# Patient Record
Sex: Male | Born: 1985 | Race: Black or African American | Hispanic: No | Marital: Single | Smoking: Former smoker
Health system: Southern US, Community
[De-identification: ages and names within clinical notes are randomized; demographics above are authoritative.]

---

## 2011-12-27 ENCOUNTER — Emergency Department (HOSPITAL_COMMUNITY)
Admission: EM | Admit: 2011-12-27 | Discharge: 2011-12-27 | Disposition: A | Discharge status: Home / Self Care | Payer: Self-pay | Attending: Emergency Medicine | Admitting: Emergency Medicine

## 2011-12-27 ENCOUNTER — Encounter (HOSPITAL_COMMUNITY): Payer: Self-pay

## 2011-12-27 DIAGNOSIS — J45901 Unspecified asthma with (acute) exacerbation: Secondary | ICD-10-CM | POA: Insufficient documentation

## 2011-12-27 DIAGNOSIS — R0789 Other chest pain: Secondary | ICD-10-CM | POA: Insufficient documentation

## 2011-12-27 DIAGNOSIS — R0602 Shortness of breath: Secondary | ICD-10-CM | POA: Insufficient documentation

## 2011-12-27 MED ORDER — IPRATROPIUM BROMIDE 0.02 % IN SOLN
0.5000 mg | Freq: Once | RESPIRATORY_TRACT | Status: AC
  Administered 2011-12-27: 0.5 mg via RESPIRATORY_TRACT
  Filled 2011-12-27: qty 2.5

## 2011-12-27 MED ORDER — ALBUTEROL SULFATE (5 MG/ML) 0.5% IN NEBU
5.0000 mg | INHALATION_SOLUTION | Freq: Once | RESPIRATORY_TRACT | Status: AC
  Administered 2011-12-27: 5 mg via RESPIRATORY_TRACT
  Filled 2011-12-27: qty 1

## 2011-12-27 MED ORDER — PREDNISONE 20 MG PO TABS
40.0000 mg | ORAL_TABLET | Freq: Once | ORAL | Status: AC
  Administered 2011-12-27: 40 mg via ORAL
  Filled 2011-12-27: qty 2

## 2011-12-27 MED ORDER — ALBUTEROL SULFATE HFA 108 (90 BASE) MCG/ACT IN AERS
2.0000 | INHALATION_SPRAY | Freq: Four times a day (QID) | RESPIRATORY_TRACT | Status: DC
  Administered 2011-12-27: 2 via RESPIRATORY_TRACT
  Filled 2011-12-27: qty 6.7

## 2011-12-27 NOTE — ED Provider Notes (Signed)
History     CSN: [id number]  Arrival date & time 12/27/11  7829   First MD Initiated Contact with Patient 12/27/11 [phone]      Chief Complaint  Patient presents with  . Shortness of Breath    (Consider location/radiation/quality/duration/timing/severity/associated sxs/prior treatment) HPI Comments: Asthma: Jaquaveon Bilal presents for evaluation of asthma. Patient's symptoms include wheezing. Associated symptoms include wheezing. The patient has been suffering from these symptoms for approximately 2 days. Symptoms have been gradually worsening since their onset. Medications used in the past to treat these symptoms include no prescription medications. Suspected precipitants include pollens. Patient is awoken from sleep approximately 1 times per night. Patient has not required Emergency Room treatment for these symptoms, and has not required hospitalization. The patient has not been intubated in the past.   Patient is a 26 y.o. male presenting with shortness of breath. The history is provided by the patient.  Shortness of Breath  Associated symptoms include shortness of breath and wheezing. Pertinent negatives include no chest pain, no fever, no rhinorrhea, no sore throat, no stridor and no cough.    Past Medical History  Diagnosis Date  . Asthma     History reviewed. No pertinent past surgical history.  Family History  Problem Relation Age of Onset  . Asthma Other     History  Substance Use Topics  . Smoking status: Never Smoker   . Smokeless tobacco: Not on file  . Alcohol Use: Yes      Review of Systems  Constitutional: Negative for fever, chills and diaphoresis.  HENT: Negative for sore throat, rhinorrhea, drooling, trouble swallowing, neck stiffness, voice change and sinus pressure.   Eyes: Negative for visual disturbance.  Respiratory: Positive for chest tightness, shortness of breath and wheezing. Negative for cough, choking and stridor.   Cardiovascular: Negative  for chest pain and palpitations.  Gastrointestinal: Negative for abdominal pain.  Genitourinary: Negative for dysuria.  Musculoskeletal: Negative for back pain and gait problem.  Skin: Negative for rash.  Neurological: Negative for syncope, speech difficulty, light-headedness and headaches.  Psychiatric/Behavioral: Negative for confusion.  All other systems reviewed and are negative.    Allergies  Review of patient's allergies indicates no known allergies.  Home Medications   Current Outpatient Rx  Name Route Sig Dispense Refill  . ALBUTEROL SULFATE HFA 108 (90 BASE) MCG/ACT IN AERS Inhalation Inhale 2 puffs into the lungs every 6 (six) hours as needed. For shortness of breath    . PSEUDOEPHEDRINE-APAP-DM 60-1000-30 MG/30ML PO LIQD Oral Take 30 mLs by mouth daily as needed. For cold and flu symptoms      BP 118/70  Pulse 63  Temp(Src) 98.2 F (36.8 C) (Oral)  Resp 24  SpO2 96%  Physical Exam  Constitutional: He is oriented to person, place, and time. He appears well-developed and well-nourished. No distress.  HENT:  Head: Normocephalic and atraumatic.       Uvula midline, oropharynx clear and moist  Eyes: Conjunctivae and EOM are normal. Pupils are equal, round, and reactive to light.  Neck: Normal range of motion.       No stridor.  Neck supple with full range of motion.  Cardiovascular: Normal rate, regular rhythm, normal heart sounds and intact distal pulses.   Pulmonary/Chest: Effort normal. He has wheezes (expiratory). He exhibits tenderness.       Patient not in respiratory distress, no accessory muscle use, nasal flaring.  Patient able to speak in full sentences.  Airway intact.  Lung auscultation  positive for bilateral expiratory wheezing  Abdominal: Soft. There is no tenderness.  Musculoskeletal: Normal range of motion. He exhibits no edema and no tenderness.  Neurological: He is alert and oriented to person, place, and time.  Skin: Skin is warm and dry. No rash  noted. He is not diaphoretic.  Psychiatric: He has a normal mood and affect. His behavior is normal.    ED Course  Procedures (including critical care time)  Labs Reviewed - No data to display No results found.   No diagnosis found.  Pt received 2 nebs and symptoms resolved, given inhaler  MDM  Asthma exacerbation  Patient ambulated in ED with O2 saturations maintained >90, no current signs of respiratory distress. Lung exam improved after hospital treatment. Pt states they are breathing at baseline. Pt has been instructed to continue using prescribed medications and to speak with PCP about today's exacerbation.             Jaci Carrel, [place] 12/27/11 [phone]

## 2011-12-27 NOTE — ED Notes (Signed)
Patient presents with intermittent shortness of breath x 2 days. Patient has a history of asthma and reports he gets a breathing treatment every 4 or 5 years.  Patient is also out of his inhaler and would like a refill.  Last refill was 4 years ago.  No wheezing noted upon auscultation.

## 2011-12-27 NOTE — Discharge Instructions (Signed)

## 2011-12-27 NOTE — ED Provider Notes (Signed)
Medical screening examination/treatment/procedure(s) were performed by non-physician practitioner and as supervising physician I was immediately available for consultation/collaboration.   Benny Lennert, MD 12/27/11 [phone]

## 2012-08-30 ENCOUNTER — Encounter (HOSPITAL_COMMUNITY): Payer: Self-pay | Admitting: Emergency Medicine

## 2012-08-30 ENCOUNTER — Emergency Department (HOSPITAL_COMMUNITY)
Admission: EM | Admit: 2012-08-30 | Discharge: 2012-08-30 | Disposition: A | Discharge status: Home / Self Care | Payer: No Typology Code available for payment source | Attending: Emergency Medicine | Admitting: Emergency Medicine

## 2012-08-30 ENCOUNTER — Emergency Department (HOSPITAL_COMMUNITY): Payer: No Typology Code available for payment source

## 2012-08-30 DIAGNOSIS — Y92410 Unspecified street and highway as the place of occurrence of the external cause: Secondary | ICD-10-CM | POA: Insufficient documentation

## 2012-08-30 DIAGNOSIS — Y9389 Activity, other specified: Secondary | ICD-10-CM | POA: Insufficient documentation

## 2012-08-30 DIAGNOSIS — Z79899 Other long term (current) drug therapy: Secondary | ICD-10-CM | POA: Insufficient documentation

## 2012-08-30 DIAGNOSIS — J45909 Unspecified asthma, uncomplicated: Secondary | ICD-10-CM | POA: Insufficient documentation

## 2012-08-30 DIAGNOSIS — S335XXA Sprain of ligaments of lumbar spine, initial encounter: Secondary | ICD-10-CM | POA: Insufficient documentation

## 2012-08-30 DIAGNOSIS — S39012A Strain of muscle, fascia and tendon of lower back, initial encounter: Secondary | ICD-10-CM

## 2012-08-30 MED ORDER — CYCLOBENZAPRINE HCL 10 MG PO TABS: 10.0000 mg | ORAL_TABLET | Freq: Three times a day (TID) | ORAL | Status: DC | PRN
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
## 2013-01-17 ENCOUNTER — Emergency Department (HOSPITAL_COMMUNITY)
Admission: EM | Admit: 2013-01-17 | Discharge: 2013-01-18 | Disposition: A | Discharge status: Home / Self Care | Payer: No Typology Code available for payment source | Attending: Emergency Medicine | Admitting: Emergency Medicine

## 2013-01-17 ENCOUNTER — Encounter (HOSPITAL_COMMUNITY): Payer: Self-pay | Admitting: Emergency Medicine

## 2013-01-17 DIAGNOSIS — R0982 Postnasal drip: Secondary | ICD-10-CM | POA: Insufficient documentation

## 2013-01-17 DIAGNOSIS — R6889 Other general symptoms and signs: Secondary | ICD-10-CM | POA: Insufficient documentation

## 2013-01-17 DIAGNOSIS — Z79899 Other long term (current) drug therapy: Secondary | ICD-10-CM | POA: Insufficient documentation

## 2013-01-17 DIAGNOSIS — J309 Allergic rhinitis, unspecified: Secondary | ICD-10-CM | POA: Insufficient documentation

## 2013-01-17 DIAGNOSIS — J45901 Unspecified asthma with (acute) exacerbation: Secondary | ICD-10-CM | POA: Insufficient documentation

## 2013-01-17 DIAGNOSIS — J3489 Other specified disorders of nose and nasal sinuses: Secondary | ICD-10-CM | POA: Insufficient documentation

## 2013-01-17 DIAGNOSIS — R0789 Other chest pain: Secondary | ICD-10-CM | POA: Insufficient documentation

## 2013-01-17 DIAGNOSIS — R059 Cough, unspecified: Secondary | ICD-10-CM | POA: Insufficient documentation

## 2013-01-17 DIAGNOSIS — R0682 Tachypnea, not elsewhere classified: Secondary | ICD-10-CM | POA: Insufficient documentation

## 2013-01-17 DIAGNOSIS — R05 Cough: Secondary | ICD-10-CM | POA: Insufficient documentation

## 2013-01-17 DIAGNOSIS — Z9109 Other allergy status, other than to drugs and biological substances: Secondary | ICD-10-CM

## 2013-01-17 MED ORDER — IPRATROPIUM BROMIDE 0.02 % IN SOLN
1.0000 mg | Freq: Once | RESPIRATORY_TRACT | Status: AC
  Administered 2013-01-17: 1 mg via RESPIRATORY_TRACT
  Filled 2013-01-17: qty 5

## 2013-01-17 MED ORDER — ALBUTEROL (5 MG/ML) CONTINUOUS INHALATION SOLN
10.0000 mg/h | INHALATION_SOLUTION | RESPIRATORY_TRACT | Status: AC
  Administered 2013-01-17: 10 mg/h via RESPIRATORY_TRACT
  Filled 2013-01-17: qty 20

## 2013-01-17 MED ORDER — PREDNISONE 20 MG PO TABS
60.0000 mg | ORAL_TABLET | Freq: Once | ORAL | Status: AC
  Administered 2013-01-18: 60 mg via ORAL
  Filled 2013-01-17: qty 3

## 2013-01-17 NOTE — ED Notes (Signed)
Pt. C/o SOB x6 days, states onset it at approx 2300 every night, states albuterol inhaler has not relieved symptoms. Pt able to speak in short sentences. Pt reports recently moving to new house x6 days ago.

## 2013-01-17 NOTE — ED Provider Notes (Signed)
History     CSN: [id number]  Arrival date & time 01/17/13  2336   First MD Initiated Contact with Patient 01/17/13 2341      Chief Complaint  Patient presents with  . Asthma    (Consider location/radiation/quality/duration/timing/severity/associated sxs/prior treatment) Patient is a 27 y.o. male presenting with asthma. The history is provided by the patient, medical records and a significant other. No language interpreter was used.  Asthma This is a recurrent problem. The current episode started in the past 7 days. The problem occurs daily. The problem has been unchanged. Associated symptoms include coughing (mild with exacerbation). Pertinent negatives include no abdominal pain, anorexia, arthralgias, change in bowel habit, chest pain, chills, congestion, diaphoresis, fatigue, fever, headaches, joint swelling, myalgias, nausea, neck pain, numbness, rash, sore throat, swollen glands, urinary symptoms, vertigo, visual change, vomiting or weakness. The symptoms are aggravated by exertion. Treatments tried: albuterol MDI. The treatment provided mild relief.    Satoshi Kalas is a 28 y.o. male  with a hx of asthma presents to the Emergency Department complaining of gradual, intermittent, progressively worsening SOB with associated wheezing only at night for the last 6 days. Pt just moved to  and into a new house with his girlfriend.  Pt uses an albuterol MDI for rescue, but does not take a daily medicaiton. Associated symptoms include rhinorrhea, sneezing and cough only with episodes of SOB.  Albuterol makes it better for a short time and exertion and nightime makes it worse.  Pt denies fever, chills, headache, neck pain, chest pain, abdominal pain, N/V/D, weakness, dizziness, syncope, dysuria.     Past Medical History  Diagnosis Date  . Asthma     History reviewed. No pertinent past surgical history.  Family History  Problem Relation Age of Onset  . Asthma Other     History   Substance Use Topics  . Smoking status: Never Smoker   . Smokeless tobacco: Not on file  . Alcohol Use: Yes      Review of Systems  Constitutional: Negative for fever, chills, diaphoresis, appetite change and fatigue.  HENT: Positive for rhinorrhea, sneezing and postnasal drip. Negative for ear pain, congestion, sore throat, mouth sores, neck pain, neck stiffness, sinus pressure and ear discharge.   Eyes: Negative for visual disturbance.  Respiratory: Positive for cough (mild with exacerbation), chest tightness, shortness of breath and wheezing. Negative for stridor.   Cardiovascular: Negative for chest pain, palpitations and leg swelling.  Gastrointestinal: Negative for nausea, vomiting, abdominal pain, diarrhea, anorexia and change in bowel habit.  Genitourinary: Negative for dysuria, urgency, frequency and hematuria.  Musculoskeletal: Negative for myalgias, back pain, joint swelling and arthralgias.  Skin: Negative for rash.  Neurological: Negative for vertigo, syncope, weakness, light-headedness, numbness and headaches.  Hematological: Negative for adenopathy.  Psychiatric/Behavioral: The patient is not nervous/anxious.   All other systems reviewed and are negative.    Allergies  Review of patient's allergies indicates no known allergies.  Home Medications   Current Outpatient Rx  Name  Route  Sig  Dispense  Refill  . albuterol (PROVENTIL HFA;VENTOLIN HFA) 108 (90 BASE) MCG/ACT inhaler   Inhalation   Inhale 2 puffs into the lungs every 4 (four) hours as needed for wheezing or shortness of breath.   1 Inhaler   3   . albuterol (PROVENTIL) (5 MG/ML) 0.5% nebulizer solution   Nebulization   Take 0.5 mLs (2.5 mg total) by nebulization every 4 (four) hours as needed for wheezing or shortness of breath.  20 mL   12   . cetirizine (ZYRTEC ALLERGY) 10 MG tablet   Oral   Take 1 tablet (10 mg total) by mouth daily.   30 tablet   1   . predniSONE (DELTASONE) 20 MG  tablet   Oral   Take 2 tablets (40 mg total) by mouth daily.   10 tablet   0     BP 116/63  Pulse 69  Temp(Src) 98.4 F (36.9 C) (Oral)  Resp 22  SpO2 99%  Physical Exam  Constitutional: He is oriented to person, place, and time. He appears well-developed and well-nourished. No distress.  HENT:  Head: Normocephalic and atraumatic.  Right Ear: Tympanic membrane, external ear and ear canal normal.  Left Ear: Tympanic membrane, external ear and ear canal normal.  Nose: Mucosal edema and rhinorrhea present. No epistaxis. Right sinus exhibits no maxillary sinus tenderness and no frontal sinus tenderness. Left sinus exhibits no maxillary sinus tenderness and no frontal sinus tenderness.  Mouth/Throat: Uvula is midline, oropharynx is clear and moist and mucous membranes are normal. Mucous membranes are not pale and not cyanotic. No oropharyngeal exudate, posterior oropharyngeal edema, posterior oropharyngeal erythema or tonsillar abscesses.  Eyes: Conjunctivae and EOM are normal. Pupils are equal, round, and reactive to light.  Neck: Normal range of motion and full passive range of motion without pain.  Cardiovascular: Normal rate, regular rhythm, normal heart sounds and intact distal pulses.   No murmur heard. Pulmonary/Chest: Accessory muscle usage present. No stridor. Tachypnea noted. He is in respiratory distress. He has decreased breath sounds. He has wheezes. He has no rhonchi. He has no rales. He exhibits no tenderness.  Abdominal: Soft. Bowel sounds are normal.  Musculoskeletal: Normal range of motion. He exhibits no edema and no tenderness.  Lymphadenopathy:    He has no cervical adenopathy.  Neurological: He is alert and oriented to person, place, and time. He exhibits normal muscle tone. Coordination normal.  Skin: Skin is warm and dry. No rash noted. He is not diaphoretic. No erythema.  Psychiatric: He has a normal mood and affect.    ED Course  Procedures (including  critical care time)  Labs Reviewed - No data to display No results found.   1. Asthma exacerbation   2. Environmental allergies       MDM  Nephtali Docken presents with asthma exacerbation.  Patient ambulated in ED with O2 saturations maintained >90, no current signs of respiratory distress. Lung exam improved after nebulizer treatment. Prednisone given in the ED and pt will bd dc with 5 day burst. Pt states they are breathing at baseline. Pt has been instructed to continue using prescribed medications and to speak with PCP about today's exacerbation.          Dahlia Client Karisma Meiser, PA-C 01/18/13 [phone]

## 2013-01-17 NOTE — ED Notes (Addendum)
Respiratory paged for continuous neb.  

## 2013-01-17 NOTE — ED Notes (Addendum)
EDPA at bedside for assessment 

## 2013-01-17 NOTE — ED Notes (Signed)
Recently moved here and has been having sob and wheezing at night over the last 6 days.  Using inhaler without any relief.

## 2013-01-18 MED ORDER — CETIRIZINE HCL 10 MG PO TABS: 10.0000 mg | ORAL_TABLET | Freq: Every day | ORAL | Status: AC

## 2013-01-18 MED ORDER — PREDNISONE 20 MG PO TABS: 40.0000 mg | ORAL_TABLET | Freq: Every day | ORAL | Status: DC

## 2013-01-18 MED ORDER — ALBUTEROL SULFATE (5 MG/ML) 0.5% IN NEBU: 2.5000 mg | INHALATION_SOLUTION | RESPIRATORY_TRACT | Status: DC | PRN

## 2013-01-18 MED ORDER — ALBUTEROL SULFATE HFA 108 (90 BASE) MCG/ACT IN AERS: 2.0000 | INHALATION_SPRAY | RESPIRATORY_TRACT | Status: DC | PRN

## 2013-01-18 NOTE — ED Notes (Signed)
Pt ambulatory in hallway without issue. SpO2 90-96%, Pulse 91-106 during ambulation.

## 2013-01-18 NOTE — ED Notes (Signed)
PT comfortable with discharge and f/u instructions. In NAD at this time.

## 2013-01-18 NOTE — ED Provider Notes (Signed)
Medical screening examination/treatment/procedure(s) were performed by non-physician practitioner and as supervising physician I was immediately available for consultation/collaboration.   Hanley Seamen, MD 01/18/13 [phone]

## 2013-02-04 ENCOUNTER — Emergency Department (HOSPITAL_COMMUNITY)
Admission: EM | Admit: 2013-02-04 | Discharge: 2013-02-04 | Disposition: A | Discharge status: Home / Self Care | Payer: Self-pay | Attending: Emergency Medicine | Admitting: Emergency Medicine

## 2013-02-04 ENCOUNTER — Encounter (HOSPITAL_COMMUNITY): Payer: Self-pay | Admitting: *Deleted

## 2013-02-04 DIAGNOSIS — Z87891 Personal history of nicotine dependence: Secondary | ICD-10-CM | POA: Insufficient documentation

## 2013-02-04 DIAGNOSIS — R0602 Shortness of breath: Secondary | ICD-10-CM | POA: Insufficient documentation

## 2013-02-04 DIAGNOSIS — J45901 Unspecified asthma with (acute) exacerbation: Secondary | ICD-10-CM | POA: Insufficient documentation

## 2013-02-04 DIAGNOSIS — Z79899 Other long term (current) drug therapy: Secondary | ICD-10-CM | POA: Insufficient documentation

## 2013-02-04 MED ORDER — ALBUTEROL SULFATE (5 MG/ML) 0.5% IN NEBU
5.0000 mg | INHALATION_SOLUTION | Freq: Once | RESPIRATORY_TRACT | Status: AC
  Administered 2013-02-04: 5 mg via RESPIRATORY_TRACT
  Filled 2013-02-04: qty 1

## 2013-02-04 MED ORDER — ALBUTEROL SULFATE HFA 108 (90 BASE) MCG/ACT IN AERS
2.0000 | INHALATION_SPRAY | RESPIRATORY_TRACT | Status: DC | PRN
  Administered 2013-02-04: 2 via RESPIRATORY_TRACT
  Filled 2013-02-04: qty 6.7

## 2013-02-04 MED ORDER — PREDNISONE 20 MG PO TABS: 60.0000 mg | ORAL_TABLET | Freq: Every day | ORAL | Status: DC

## 2013-02-04 MED ORDER — PREDNISONE 20 MG PO TABS
60.0000 mg | ORAL_TABLET | Freq: Once | ORAL | Status: AC
  Administered 2013-02-04: 60 mg via ORAL
  Filled 2013-02-04: qty 3

## 2013-02-04 MED ORDER — ALBUTEROL SULFATE (2.5 MG/3ML) 0.083% IN NEBU: 2.5000 mg | INHALATION_SOLUTION | Freq: Four times a day (QID) | RESPIRATORY_TRACT | Status: DC | PRN

## 2013-02-04 NOTE — ED Provider Notes (Signed)
History     CSN: [id number]  Arrival date & time 02/04/13  4696   First MD Initiated Contact with Patient 02/04/13 0403      Chief Complaint  Patient presents with  . Asthma    (Consider location/radiation/quality/duration/timing/severity/associated sxs/prior treatment) HPI History provided by patient, wheezing since yesterday worse today, no cough,some SOB, no F/C, he attributes his symptoms to turning on the [facility] in house, no known triggers otherwise, no h/o intubation or admit for lifelong h/o asthma. Symptoms MOD in severity, worse with exertion.   Past Medical History  Diagnosis Date  . Asthma     History reviewed. No pertinent past surgical history.  Family History  Problem Relation Age of Onset  . Asthma Other     History  Substance Use Topics  . Smoking status: Former Games developer  . Smokeless tobacco: Not on file  . Alcohol Use: Yes     Comment: socially      Review of Systems  Constitutional: Negative for fever and chills.  HENT: Negative for sore throat, neck pain, neck stiffness and voice change.   Eyes: Negative for pain.  Respiratory: Positive for shortness of breath and wheezing.   Cardiovascular: Negative for chest pain.  Gastrointestinal: Negative for abdominal pain.  Genitourinary: Negative for dysuria.  Musculoskeletal: Negative for back pain.  Skin: Negative for rash.  Neurological: Negative for headaches.  All other systems reviewed and are negative.    Allergies  Review of patient's allergies indicates no known allergies.  Home Medications   Current Outpatient Rx  Name  Route  Sig  Dispense  Refill  . albuterol (PROVENTIL HFA;VENTOLIN HFA) 108 (90 BASE) MCG/ACT inhaler   Inhalation   Inhale 2 puffs into the lungs every 4 (four) hours as needed for wheezing or shortness of breath.   1 Inhaler   3   . albuterol (PROVENTIL) (5 MG/ML) 0.5% nebulizer solution   Nebulization   Take 0.5 mLs (2.5 mg total) by nebulization every 4 (four)  hours as needed for wheezing or shortness of breath.   20 mL   12   . cetirizine (ZYRTEC ALLERGY) 10 MG tablet   Oral   Take 1 tablet (10 mg total) by mouth daily.   30 tablet   1     BP 110/86  Pulse 74  Temp(Src) 98 F (36.7 C) (Oral)  Resp 20  SpO2 98%  Physical Exam  Constitutional: He is oriented to person, place, and time. He appears well-developed and well-nourished.  HENT:  Head: Normocephalic and atraumatic.  Eyes: EOM are normal. Pupils are equal, round, and reactive to light.  Neck: Neck supple. No tracheal deviation present.  Cardiovascular: Normal rate, regular rhythm and intact distal pulses.   Pulmonary/Chest: Effort normal. No stridor. No respiratory distress.  bilat exp wheezes  Abdominal: Soft. Bowel sounds are normal. He exhibits no distension. There is no tenderness.  Musculoskeletal: Normal range of motion. He exhibits no edema.  Neurological: He is alert and oriented to person, place, and time.  Skin: Skin is warm and dry.    ED Course  Procedures (including critical care time)  Albuterol treatment and prednisone provided  Old records - recent prior ED visit for same received albuterol and steroids  4:43 AM recheck - feeling much better, wants to be discharged, requesting RX/ referrals provided with return precautions.  Repeat exam wheezes improved, moving good air.   MDM  Acute asthma exacerbation Medications provided Serial evaluations VS, old records and nursing  notes reviewed       Sunnie Nielsen, MD 02/04/13 [phone]

## 2013-02-04 NOTE — ED Notes (Signed)
Pt states that he has a history of Asthma and has been using Nebs without improvement; pt states that he has felt short of breath x 2 days

## 2013-02-23 ENCOUNTER — Emergency Department (HOSPITAL_COMMUNITY)
Admission: EM | Admit: 2013-02-23 | Discharge: 2013-02-23 | Disposition: A | Discharge status: Home / Self Care | Payer: Self-pay | Attending: Emergency Medicine | Admitting: Emergency Medicine

## 2013-02-23 ENCOUNTER — Encounter (HOSPITAL_COMMUNITY): Payer: Self-pay | Admitting: Emergency Medicine

## 2013-02-23 DIAGNOSIS — Z79899 Other long term (current) drug therapy: Secondary | ICD-10-CM | POA: Insufficient documentation

## 2013-02-23 DIAGNOSIS — Z87891 Personal history of nicotine dependence: Secondary | ICD-10-CM | POA: Insufficient documentation

## 2013-02-23 DIAGNOSIS — IMO0002 Reserved for concepts with insufficient information to code with codable children: Secondary | ICD-10-CM | POA: Insufficient documentation

## 2013-02-23 DIAGNOSIS — J45901 Unspecified asthma with (acute) exacerbation: Secondary | ICD-10-CM | POA: Insufficient documentation

## 2013-02-23 DIAGNOSIS — J4541 Moderate persistent asthma with (acute) exacerbation: Secondary | ICD-10-CM

## 2013-02-23 MED ORDER — ALBUTEROL SULFATE (5 MG/ML) 0.5% IN NEBU: 5.0000 mg | INHALATION_SOLUTION | Freq: Once | RESPIRATORY_TRACT | Status: DC

## 2013-02-23 MED ORDER — IPRATROPIUM BROMIDE 0.02 % IN SOLN
0.5000 mg | Freq: Once | RESPIRATORY_TRACT | Status: AC
  Administered 2013-02-23: 0.5 mg via RESPIRATORY_TRACT
  Filled 2013-02-23: qty 2.5

## 2013-02-23 MED ORDER — PREDNISONE 10 MG PO TABS: 10.0000 mg | ORAL_TABLET | Freq: Every day | ORAL | Status: DC

## 2013-02-23 MED ORDER — ALBUTEROL SULFATE (5 MG/ML) 0.5% IN NEBU
5.0000 mg | INHALATION_SOLUTION | Freq: Once | RESPIRATORY_TRACT | Status: DC
  Filled 2013-02-23: qty 1

## 2013-02-23 MED ORDER — IPRATROPIUM BROMIDE 0.02 % IN SOLN: 0.5000 mg | Freq: Once | RESPIRATORY_TRACT | Status: DC

## 2013-02-23 MED ORDER — PREDNISONE 20 MG PO TABS
60.0000 mg | ORAL_TABLET | Freq: Once | ORAL | Status: AC
  Administered 2013-02-23: 60 mg via ORAL
  Filled 2013-02-23: qty 3

## 2013-02-23 MED ORDER — ALBUTEROL SULFATE (5 MG/ML) 0.5% IN NEBU
5.0000 mg | INHALATION_SOLUTION | Freq: Once | RESPIRATORY_TRACT | Status: AC
  Administered 2013-02-23: 5 mg via RESPIRATORY_TRACT

## 2013-02-23 MED ORDER — FLUTICASONE PROPIONATE HFA 110 MCG/ACT IN AERO: 1.0000 | INHALATION_SPRAY | Freq: Two times a day (BID) | RESPIRATORY_TRACT | Status: DC

## 2013-02-23 MED ORDER — ALBUTEROL SULFATE (5 MG/ML) 0.5% IN NEBU
5.0000 mg | INHALATION_SOLUTION | Freq: Once | RESPIRATORY_TRACT | Status: AC
  Administered 2013-02-23: 5 mg via RESPIRATORY_TRACT
  Filled 2013-02-23: qty 1

## 2013-02-23 NOTE — ED Notes (Signed)
Family at bedside. 

## 2013-02-23 NOTE — ED Provider Notes (Signed)
Patient seen/examined in the Emergency Department in conjunction with Midlevel Provider Neva Seat Patient reports short of breath due to asthma Exam : awake/alert, scattered wheeze but improved after nebs Plan: likely d/c home.  May benefit from longer oral steroid regimen.  He may also need daily inhaled corticosteroid due to frequent asthma exacerbations   Joya Gaskins, MD 02/23/13 [phone]

## 2013-02-23 NOTE — ED Provider Notes (Signed)
History     CSN: [id number]  Arrival date & time 02/23/13  6295   None     Chief Complaint  Patient presents with  . Asthma    (Consider location/radiation/quality/duration/timing/severity/associated sxs/prior treatment) HPI   Cody Terrell is a 27 y.o.male presenting to the ER with complaints of wheezing the past couple of days. He used his nebulizer treatment once last night which helped for a few hours. No cough,some SOB. He recently moved from a location with a lot of allergens into a much cleaner location which he says has helped.  He has been to the ER 3 times in the past couple of months for his asthma and says that these past couple of months it has been worse but he is not sure why. Has been told he should take an allergy pill but hasnt done it. Says he will do so this time to prevent having to come to the ED., no h/o intubation or admit for lifelong h/o asthma. Symptoms MOD in severity, worse with exertion.    Past Medical History  Diagnosis Date  . Asthma     History reviewed. No pertinent past surgical history.  Family History  Problem Relation Age of Onset  . Asthma Other     History  Substance Use Topics  . Smoking status: Former Games developer  . Smokeless tobacco: Not on file  . Alcohol Use: Yes     Comment: socially      Review of Systems  Respiratory: Positive for shortness of breath and wheezing. Negative for apnea, cough, choking, chest tightness and stridor.   All other systems reviewed and are negative.    Allergies  Review of patient's allergies indicates no known allergies.  Home Medications   Current Outpatient Rx  Name  Route  Sig  Dispense  Refill  . albuterol (PROVENTIL HFA;VENTOLIN HFA) 108 (90 BASE) MCG/ACT inhaler   Inhalation   Inhale 2 puffs into the lungs every 4 (four) hours as needed for wheezing or shortness of breath.   1 Inhaler   3   . albuterol (PROVENTIL) (2.5 MG/3ML) 0.083% nebulizer solution   Nebulization   Take 3  mLs (2.5 mg total) by nebulization every 6 (six) hours as needed for wheezing.   75 mL   12   . cetirizine (ZYRTEC ALLERGY) 10 MG tablet   Oral   Take 1 tablet (10 mg total) by mouth daily.   30 tablet   1   . fluticasone (FLOVENT HFA) 110 MCG/ACT inhaler   Inhalation   Inhale 1 puff into the lungs 2 (two) times daily.   1 Inhaler   1   . predniSONE (DELTASONE) 10 MG tablet   Oral   Take 1 tablet (10 mg total) by mouth daily.   30 tablet   0     Prednisone dose pack directions:   5 tabs on day ...     BP 116/77  Pulse 65  Temp(Src) 97.7 F (36.5 C) (Oral)  Resp 25  SpO2 97%  Physical Exam Constitutional: Negative for fever and chills.  HENT: Negative for sore throat, neck pain, neck stiffness and voice change.  Eyes: Negative for pain.  Respiratory: Positive for shortness of breath and wheezing.  Cardiovascular: Negative for chest pain.  Gastrointestinal: Negative for abdominal pain.  Genitourinary: Negative for dysuria.  Musculoskeletal: Negative for back pain.  Skin: Negative for rash.  Neurological: Negative for headaches.  All other systems reviewed and are negative.  ED Course  Procedures (including critical care time)  Labs Reviewed - No data to display No results found.   1. Asthma exacerbation attacks, moderate persistent       MDM  Prednisone, alb/atrovent neb. Will monitor. 5:54am  7:49am- Dr. Bebe Shaggy saw patient as well. Will give Rx for Flovent, advise him and his girlfriend that it costs $200 a month. They will buy it the first month and then I give them resources for the [employer], [place], family practice clinics in the area. Coupon found for [employer] and printed off to give to girlfriend/pt.    Albuterol/Atrovent neb treatment given 1 more, pt feeling much better.   Rx: Flovent and 10 day prednisone taper.   27 y.o.Cody Terrell's evaluation in the Emergency Department is complete. It has been determined  that no acute conditions requiring further emergency intervention are present at this time. The patient/guardian have been advised of the diagnosis and plan. We have discussed signs and symptoms that warrant return to the ED, such as changes or worsening in symptoms.  Vital signs are stable at discharge. Filed Vitals:   02/23/13 0700  BP: 116/77  Pulse: 65  Temp:   Resp:     Patient/guardian has voiced understanding and agreed to follow-up with the PCP or specialist.       Dorthula Matas, PA-C 02/23/13 [phone]

## 2013-02-24 NOTE — ED Provider Notes (Signed)
Medical screening examination/treatment/procedure(s) were conducted as a shared visit with non-physician practitioner(s) and myself.  I personally evaluated the patient during the encounter  Pt improved in the ED and he is stable for d/c   Joya Gaskins, MD 02/24/13 [phone]

## 2014-01-11 ENCOUNTER — Encounter (HOSPITAL_COMMUNITY): Payer: Self-pay | Admitting: Emergency Medicine

## 2014-01-11 ENCOUNTER — Emergency Department (HOSPITAL_COMMUNITY)
Admission: EM | Admit: 2014-01-11 | Discharge: 2014-01-11 | Disposition: A | Discharge status: Home / Self Care | Payer: No Typology Code available for payment source | Attending: Emergency Medicine | Admitting: Emergency Medicine

## 2014-01-11 DIAGNOSIS — J45901 Unspecified asthma with (acute) exacerbation: Secondary | ICD-10-CM | POA: Insufficient documentation

## 2014-01-11 DIAGNOSIS — Z79899 Other long term (current) drug therapy: Secondary | ICD-10-CM | POA: Insufficient documentation

## 2014-01-11 DIAGNOSIS — J302 Other seasonal allergic rhinitis: Secondary | ICD-10-CM

## 2014-01-11 DIAGNOSIS — J45909 Unspecified asthma, uncomplicated: Secondary | ICD-10-CM

## 2014-01-11 DIAGNOSIS — Z87891 Personal history of nicotine dependence: Secondary | ICD-10-CM | POA: Insufficient documentation

## 2014-01-11 MED ORDER — PREDNISONE 20 MG PO TABS: 40.0000 mg | ORAL_TABLET | Freq: Every day | ORAL | Status: DC

## 2014-01-11 MED ORDER — ALBUTEROL SULFATE (2.5 MG/3ML) 0.083% IN NEBU
5.0000 mg | INHALATION_SOLUTION | Freq: Once | RESPIRATORY_TRACT | Status: AC
  Administered 2014-01-11: 5 mg via RESPIRATORY_TRACT
  Filled 2014-01-11: qty 6

## 2014-01-11 MED ORDER — PREDNISONE 20 MG PO TABS
60.0000 mg | ORAL_TABLET | Freq: Once | ORAL | Status: AC
  Administered 2014-01-11: 60 mg via ORAL
  Filled 2014-01-11: qty 3

## 2014-01-11 MED ORDER — CETIRIZINE-PSEUDOEPHEDRINE ER 5-120 MG PO TB12: 1.0000 | ORAL_TABLET | Freq: Every day | ORAL | Status: AC

## 2014-01-11 MED ORDER — ALBUTEROL SULFATE HFA 108 (90 BASE) MCG/ACT IN AERS
1.0000 | INHALATION_SPRAY | Freq: Four times a day (QID) | RESPIRATORY_TRACT | Status: DC | PRN
  Filled 2014-01-11: qty 6.7

## 2014-01-11 NOTE — ED Notes (Signed)
Neb Tx complete.

## 2014-01-11 NOTE — ED Provider Notes (Signed)
CSN: [id number]     Arrival date & time 01/11/14  [id number] History   First MD Initiated Contact with Patient 01/11/14 0715     Chief Complaint  Patient presents with  . Wheezing     (Consider location/radiation/quality/duration/timing/severity/associated sxs/prior Treatment) HPI Comments: Pt has been moving and likely working in dusty environment, also it is spring and lots of pollen in the air.  PT has been on zyrtec recently that was helping for a while, but now worse again, ran out of it recently.  Pt has an inhaler still. No itching.  Eyes have been red.  Night is worse with nasal congestion, mild sore throat, sinus and post nasal drainage.    Patient is a 28 y.o. male presenting with wheezing. The history is provided by the patient.  Wheezing Severity:  Moderate Severity compared to prior episodes:  Similar Onset quality:  Gradual Duration:  4 days Timing:  Constant Progression:  Waxing and waning Chronicity:  Recurrent Relieved by:  Beta-agonist inhaler Worsened by:  Nothing tried Associated symptoms: chest tightness, cough, rhinorrhea and shortness of breath   Associated symptoms: no fever     Past Medical History  Diagnosis Date  . Asthma    History reviewed. No pertinent past surgical history. Family History  Problem Relation Age of Onset  . Asthma Other    History  Substance Use Topics  . Smoking status: Former Games developermoker  . Smokeless tobacco: Not on file  . Alcohol Use: Yes     Comment: socially    Review of Systems  Constitutional: Negative for fever.  HENT: Positive for congestion, postnasal drip, rhinorrhea and sinus pressure.   Respiratory: Positive for cough, chest tightness, shortness of breath and wheezing.   Gastrointestinal: Negative for nausea, vomiting and abdominal pain.  All other systems reviewed and are negative.     Allergies  Review of patient's allergies indicates no known allergies.  Home Medications   Prior to Admission medications    Medication Sig Start Date End Date Taking? Authorizing Provider  albuterol (PROVENTIL HFA;VENTOLIN HFA) 108 (90 BASE) MCG/ACT inhaler Inhale 2 puffs into the lungs every 6 (six) hours as needed for wheezing or shortness of breath.   Yes Historical Provider, MD  Multiple Vitamins-Minerals (MULTIVITAMIN WITH MINERALS) tablet Take 1 tablet by mouth daily.   Yes Historical Provider, MD  cetirizine (ZYRTEC ALLERGY) 10 MG tablet Take 1 tablet (10 mg total) by mouth daily. 01/18/13   Hannah Muthersbaugh, PA-C   BP 139/72  Pulse 86  Temp(Src) 97.5 F (36.4 C) (Oral)  Resp 18  Ht 6\' 2"  (1.88 m)  Wt 190 lb (86.183 kg)  BMI 24.38 kg/m2  SpO2 99% Physical Exam  Nursing note and vitals reviewed. Constitutional: He is oriented to person, place, and time. No distress.  HENT:  Head: Normocephalic and atraumatic.  Nose: Right sinus exhibits frontal sinus tenderness. Left sinus exhibits frontal sinus tenderness.  Mouth/Throat: Uvula is midline.  Eyes: Right conjunctiva is injected. Left conjunctiva is injected.  Neck: Normal range of motion. Neck supple.  Cardiovascular: Normal rate.   Pulmonary/Chest: No accessory muscle usage. No respiratory distress. He has no decreased breath sounds. He has wheezes.  Abdominal: Soft. There is no tenderness.  Musculoskeletal: He exhibits no edema and no tenderness.  Lymphadenopathy:    He has no cervical adenopathy.  Neurological: He is alert and oriented to person, place, and time. Coordination normal.  Skin: Skin is warm and dry. He is not diaphoretic.  Psychiatric: He  has a normal mood and affect.    ED Course  Procedures (including critical care time) Labs Review Labs Reviewed - No data to display  Imaging Review No results found.   EKG Interpretation None      RA sat is 100% and I interpret to be normal  8:20 AM Pt feels somewhat improved.  will d/c home.  He requests an inhaler, one is provided MDM   Final diagnoses:  Seasonal allergies   Asthmatic bronchitis    Pt is nto toxic appearing.  Pt has allergies, sinus drainage, and wheezing.  Will treat with another nebulizer tx, oral prednisone and give a Rx for seasonal allergies to use at home.      Gavin PoundMichael Y. Oletta LamasGhim, MD 01/11/14 [phone]

## 2014-01-11 NOTE — ED Notes (Signed)
Pt. States that the symptoms started about 1.5 to 2 weeks ago. C/O SOB and wheezing. Pt. Has hx. Of asthma.
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
## 2015-01-14 ENCOUNTER — Telehealth: Payer: Self-pay | Admitting: *Deleted

## 2015-01-14 ENCOUNTER — Emergency Department (HOSPITAL_COMMUNITY)
Admission: EM | Admit: 2015-01-14 | Discharge: 2015-01-14 | Disposition: A | Discharge status: Home / Self Care | Payer: 59 | Attending: Emergency Medicine | Admitting: Emergency Medicine

## 2015-01-14 ENCOUNTER — Encounter (HOSPITAL_COMMUNITY): Payer: Self-pay | Admitting: Emergency Medicine

## 2015-01-14 DIAGNOSIS — J45901 Unspecified asthma with (acute) exacerbation: Secondary | ICD-10-CM | POA: Diagnosis not present

## 2015-01-14 DIAGNOSIS — Z7952 Long term (current) use of systemic steroids: Secondary | ICD-10-CM | POA: Diagnosis not present

## 2015-01-14 DIAGNOSIS — J45909 Unspecified asthma, uncomplicated: Secondary | ICD-10-CM | POA: Diagnosis present

## 2015-01-14 DIAGNOSIS — Z79899 Other long term (current) drug therapy: Secondary | ICD-10-CM | POA: Insufficient documentation

## 2015-01-14 DIAGNOSIS — J301 Allergic rhinitis due to pollen: Secondary | ICD-10-CM | POA: Diagnosis not present

## 2015-01-14 DIAGNOSIS — Z87891 Personal history of nicotine dependence: Secondary | ICD-10-CM | POA: Diagnosis not present

## 2015-01-14 DIAGNOSIS — J302 Other seasonal allergic rhinitis: Secondary | ICD-10-CM

## 2015-01-14 MED ORDER — ALBUTEROL SULFATE HFA 108 (90 BASE) MCG/ACT IN AERS
2.0000 | INHALATION_SPRAY | Freq: Once | RESPIRATORY_TRACT | Status: AC
  Administered 2015-01-14: 2 via RESPIRATORY_TRACT
  Filled 2015-01-14: qty 6.7

## 2015-01-14 MED ORDER — PREDNISONE 20 MG PO TABS
60.0000 mg | ORAL_TABLET | Freq: Once | ORAL | Status: AC
  Administered 2015-01-14: 60 mg via ORAL
  Filled 2015-01-14: qty 3

## 2015-01-14 MED ORDER — FLUTICASONE PROPIONATE 50 MCG/ACT NA SUSP: 2.0000 | Freq: Every day | NASAL | Status: AC

## 2015-01-14 MED ORDER — PREDNISONE 10 MG PO TABS: ORAL_TABLET | ORAL | Status: AC

## 2015-01-14 MED ORDER — IPRATROPIUM-ALBUTEROL 0.5-2.5 (3) MG/3ML IN SOLN
3.0000 mL | Freq: Once | RESPIRATORY_TRACT | Status: AC
  Administered 2015-01-14: 3 mL via RESPIRATORY_TRACT
  Filled 2015-01-14: qty 3

## 2015-01-14 MED ORDER — ALBUTEROL SULFATE (5 MG/ML) 0.5% IN NEBU: 2.5000 mg | INHALATION_SOLUTION | Freq: Four times a day (QID) | RESPIRATORY_TRACT | Status: DC | PRN

## 2015-01-14 NOTE — Discharge Instructions (Signed)
Start taking seasonal allergy medication daily such as Zyrtec or Claritin. Take Flonase daily to help with congestion and sinus pressure. For the next 3 days, use either a breathing treatment or inhaler every 4 hours. After that use it as needed only. Take prednisone as prescribed starting tomorrow. Return if worsening symptoms. Follow-up with a primary care doctor.     Asthma Attack Prevention Although there is no way to prevent asthma from starting, you can take steps to control the disease and reduce its symptoms. Learn about your asthma and how to control it. Take an active role to control your asthma by working with your health care provider to create and follow an asthma action plan. An asthma action plan guides you in:  Taking your medicines properly.  Avoiding things that set off your asthma or make your asthma worse (asthma triggers).  Tracking your level of asthma control.  Responding to worsening asthma.  Seeking emergency care when needed. To track your asthma, keep records of your symptoms, check your peak flow number using a handheld device that shows how well air moves out of your lungs (peak flow meter), and get regular asthma checkups.  WHAT ARE SOME WAYS TO PREVENT AN ASTHMA ATTACK?  Take medicines as directed by your health care provider.  Keep track of your asthma symptoms and level of control.  With your health care provider, write a detailed plan for taking medicines and managing an asthma attack. Then be sure to follow your action plan. Asthma is an ongoing condition that needs regular monitoring and treatment.  Identify and avoid asthma triggers. Many outdoor allergens and irritants (such as pollen, mold, cold air, and air pollution) can trigger asthma attacks. Find out what your asthma triggers are and take steps to avoid them.  Monitor your breathing. Learn to recognize warning signs of an attack, such as coughing, wheezing, or shortness of breath. Your lung  function may decrease before you notice any signs or symptoms, so regularly measure and record your peak airflow with a home peak flow meter.  Identify and treat attacks early. If you act quickly, you are less likely to have a severe attack. You will also need less medicine to control your symptoms. When your peak flow measurements decrease and alert you to an upcoming attack, take your medicine as instructed and immediately stop any activity that may have triggered the attack. If your symptoms do not improve, get medical help.  Pay attention to increasing quick-relief inhaler use. If you find yourself relying on your quick-relief inhaler, your asthma is not under control. See your health care provider about adjusting your treatment. WHAT CAN MAKE MY SYMPTOMS WORSE? A number of common things can set off or make your asthma symptoms worse and cause temporary increased inflammation of your airways. Keep track of your asthma symptoms for several weeks, detailing all the environmental and emotional factors that are linked with your asthma. When you have an asthma attack, go back to your asthma diary to see which factor, or combination of factors, might have contributed to it. Once you know what these factors are, you can take steps to control many of them. If you have allergies and asthma, it is important to take asthma prevention steps at home. Minimizing contact with the substance to which you are allergic will help prevent an asthma attack. Some triggers and ways to avoid these triggers are: Animal Dander:  Some people are allergic to the flakes of skin or dried saliva from  animals with fur or feathers.   There is no such thing as a hypoallergenic dog or cat breed. All dogs or cats can cause allergies, even if they don't shed.  Keep these pets out of your home.  If you are not able to keep a pet outdoors, keep the pet out of your bedroom and other sleeping areas at all times, and keep the door  closed.  Remove carpets and furniture covered with cloth from your home. If that is not possible, keep the pet away from fabric-covered furniture and carpets. Dust Mites: Many people with asthma are allergic to dust mites. Dust mites are tiny bugs that are found in every home in mattresses, pillows, carpets, fabric-covered furniture, bedcovers, clothes, stuffed toys, and other fabric-covered items.   Cover your mattress in a special dust-proof cover.  Cover your pillow in a special dust-proof cover, or wash the pillow each week in hot water. Water must be hotter than 130 F (54.4 C) to kill dust mites. Cold or warm water used with detergent and bleach can also be effective.  Wash the sheets and blankets on your bed each week in hot water.  Try not to sleep or lie on cloth-covered cushions.  Call ahead when traveling and ask for a smoke-free hotel room. Bring your own bedding and pillows in case the hotel only supplies feather pillows and down comforters, which may contain dust mites and cause asthma symptoms.  Remove carpets from your bedroom and those laid on concrete, if you can.  Keep stuffed toys out of the bed, or wash the toys weekly in hot water or cooler water with detergent and bleach. Cockroaches: Many people with asthma are allergic to the droppings and remains of cockroaches.   Keep food and garbage in closed containers. Never leave food out.  Use poison baits, traps, powders, gels, or paste (for example, boric acid).  If a spray is used to kill cockroaches, stay out of the room until the odor goes away. Indoor Mold:  Fix leaky faucets, pipes, or other sources of water that have mold around them.  Clean floors and moldy surfaces with a fungicide or diluted bleach.  Avoid using humidifiers, vaporizers, or swamp coolers. These can spread molds through the air. Pollen and Outdoor Mold:  When pollen or mold spore counts are high, try to keep your windows closed.  Stay  indoors with windows closed from late morning to afternoon. Pollen and some mold spore counts are highest at that time.  Ask your health care provider whether you need to take anti-inflammatory medicine or increase your dose of the medicine before your allergy season starts. Other Irritants to Avoid:  Tobacco smoke is an irritant. If you smoke, ask your health care provider how you can quit. Ask family members to quit smoking, too. Do not allow smoking in your home or car.  If possible, do not use a wood-burning stove, kerosene heater, or fireplace. Minimize exposure to all sources of smoke, including incense, candles, fires, and fireworks.  Try to stay away from strong odors and sprays, such as perfume, talcum powder, hair spray, and paints.  Decrease humidity in your home and use an indoor air cleaning device. Reduce indoor humidity to below 60%. Dehumidifiers or central air conditioners can do this.  Decrease house dust exposure by changing furnace and air cooler filters frequently.  Try to have someone else vacuum for you once or twice a week. Stay out of rooms while they are being vacuumed  and for a short while afterward.  If you vacuum, use a dust mask from a hardware store, a double-layered or microfilter vacuum cleaner bag, or a vacuum cleaner with a HEPA filter.  Sulfites in foods and beverages can be irritants. Do not drink beer or wine or eat dried fruit, processed potatoes, or shrimp if they cause asthma symptoms.  Cold air can trigger an asthma attack. Cover your nose and mouth with a scarf on cold or windy days.  Several health conditions can make asthma more difficult to manage, including a runny nose, sinus infections, reflux disease, psychological stress, and sleep apnea. Work with your health care provider to manage these conditions.  Avoid close contact with people who have a respiratory infection such as a cold or the flu, since your asthma symptoms may get worse if you  catch the infection. Wash your hands thoroughly after touching items that may have been handled by people with a respiratory infection.  Get a flu shot every year to protect against the flu virus, which often makes asthma worse for days or weeks. Also get a pneumonia shot if you have not previously had one. Unlike the flu shot, the pneumonia shot does not need to be given yearly. Medicines:  Talk to your health care provider about whether it is safe for you to take aspirin or non-steroidal anti-inflammatory medicines (NSAIDs). In a small number of people with asthma, aspirin and NSAIDs can cause asthma attacks. These medicines must be avoided by people who have known aspirin-sensitive asthma. It is important that people with aspirin-sensitive asthma read labels of all over-the-counter medicines used to treat pain, colds, coughs, and fever.  Beta-blockers and ACE inhibitors are other medicines you should discuss with your health care provider. HOW CAN I FIND OUT WHAT I AM ALLERGIC TO? Ask your asthma health care provider about allergy skin testing or blood testing (the RAST test) to identify the allergens to which you are sensitive. If you are found to have allergies, the most important thing to do is to try to avoid exposure to any allergens that you are sensitive to as much as possible. Other treatments for allergies, such as medicines and allergy shots (immunotherapy) are available.  CAN I EXERCISE? Follow your health care provider's advice regarding asthma treatment before exercising. It is important to maintain a regular exercise program, but vigorous exercise or exercise in cold, humid, or dry environments can cause asthma attacks, especially for those people who have exercise-induced asthma. Document Released: 08/18/2009 Document Revised: 09/04/2013 Document Reviewed: 03/07/2013 [facility] Patient Information 2015 [place], [place]. This information is not intended to replace advice given to you by  your health care provider. Make sure you discuss any questions you have with your health care provider.   Emergency Department Resource Guide 1) Find a Doctor and Pay Out of Pocket Although you won't have to find out who is covered by your insurance plan, it is a good idea to ask around and get recommendations. You will then need to call the office and see if the doctor you have chosen will accept you as a new patient and what types of options they offer for patients who are self-pay. Some doctors offer discounts or will set up payment plans for their patients who do not have insurance, but you will need to ask so you aren't surprised when you get to your appointment.  2) Contact Your Local Health Department Not all health departments have doctors that can see patients for sick  visits, but many do, so it is worth a call to see if yours does. If you don't know where your local health department is, you can check in your phone book. The CDC also has a tool to help you locate your state's health department, and many state websites also have listings of all of their local health departments.  3) Find a Walk-in Clinic If your illness is not likely to be very severe or complicated, you may want to try a walk in clinic. These are popping up all over the country in pharmacies, drugstores, and shopping centers. They're usually staffed by nurse practitioners or physician assistants that have been trained to treat common illnesses and complaints. They're usually fairly quick and inexpensive. However, if you have serious medical issues or chronic medical problems, these are probably not your best option.  No Primary Care Doctor: - Call Health Connect at  [phone] - they can help you locate a primary care doctor that  accepts your insurance, provides certain services, etc. - Physician Referral Service- [phone]  Chronic Pain Problems: Organization         Address  Phone   Notes  [facility]  [phone] Patients need to be referred by their primary care doctor.   Medication Assistance: Organization         Address  Phone   Notes  [facility] Medication [facility] [address] [place] [place], [place] [address] [phone] --Must be a resident of [facility] -- Must have NO insurance coverage whatsoever (no Medicaid/ Medicare, etc.) -- The pt. MUST have a primary care doctor that directs their care regularly and follows them in the community   [facility]  [phone]   [employer]  [phone]    Agencies that provide inexpensive medical care: Organization         Address  Phone   Notes  [facility]  [phone]   [facility]    [phone]   [facility] [address] [place], [place] [address] [phone]   [facility] [address] [place], [place] [phone]   Planned Parenthood    [phone]   [facility]    [phone]   [facility]  [address], [place] Phone:  [phone], Fax:  [phone] Hours of Operation:  9 am - 6 pm, M-F.  Also accepts Medicaid/Medicare and self-pay.  [facility]  [address], [place] Phone: [phone], Fax: [phone]. Hours of Operation:  8:30 am - 5:30 pm, M-F.  Also accepts Medicaid and self-pay.  [facility] [place] [address] [place] Phone: [phone]   [facility] [address] Natasha Bence [place], [place] [phone], Ext. [phone] Mondays & Thursdays: 7-9 AM.  First 15 patients are seen on a first come, first serve basis.    Medicaid-accepting [facility] Providers:  Organization         Address  Phone   Notes  [facility] [address], [place] [phone] Also accepts self-pay patients.  [facility] [address] Laurell Josephs [place],  [place]  [phone]   [facility] [address], [place] [phone]   [facility] [address], [place] [phone]  Lucianne Lei [address], [place]   [phone] Only accepts [place] patients after they have their name applied to their card.   Self-Pay (no insurance) in [facility]:  Organization         Address  Phone   Notes  Sickle Cell Patients, [facility] [place] [phone]   [facility] [place] [phone]   [facility] [place]  [place], [place], [place] [phone]   [facility]/Dr. Osei-Bonsu  [address] [place] or [address], [place] [phone] Phone number for both [place] and [place] locations is the same.  [facility] [address], [place] [phone]   [facility] [address] [place] or [address] [phone]   [facility] [address] [place] [phone], phone; [phone], fax Sees patients 1st and 3rd Saturday of every month.  Must not qualify for public or private insurance (i.e. Medicaid, Medicare, [place] Health Choice, Veterans' Benefits)  Household income should be no more than 200% of the poverty level The clinic cannot treat you if you are pregnant or think you are pregnant  Sexually transmitted diseases are not treated at the clinic.    Dental Care: Organization         Address  Phone  Notes  [facility] Department of [facility] [place] [phone] Accepts children up to age 17 who are enrolled in [place] or [place]; pregnant women with a Medicaid card; and children who have applied for Medicaid or [place] Health  Choice, but were declined, whose parents can pay a reduced fee at time of service.  [facility] Department of [facility]  [address], [place] [phone] Accepts children up to age 54 who are enrolled in [place] or [place]; pregnant women with a Medicaid card; and children who have applied for Medicaid or [place] Health Choice, but were declined, whose parents can pay a reduced fee at time of service.  [facility]  [place] [phone] Patients are seen by appointment only. Walk-ins are not accepted. [place] will see patients 27 years of age and older. Monday - Tuesday (8am-5pm) Most Wednesdays (8:30-5pm) $30 per visit, cash only  [facility] Adult Dental Access PROGRAM  [address], [facility] [phone] Patients are seen by appointment only. Walk-ins are not accepted. [place] will see patients 74 years of age and older. One Wednesday Evening (Monthly: Volunteer Based).  $30 per visit, cash only  [place]  [phone] for adults; Children under age 51, call Graduate Pediatric Dentistry at [phone]. Children aged 35-14, please call [phone] to request a pediatric application.  Dental services are provided in all areas of dental care including fillings, crowns and bridges, complete and partial dentures, implants, gum treatment, root canals, and extractions. Preventive care is also provided. Treatment is provided to both adults and children. Patients are selected via a lottery and there is often a waiting list.   [facility] [address] [place]  [phone] [email or web address], [place], [place] [phone], Ext. [phone]  Second and Fourth Thursday of each month, opens at 6:30 AM; Clinic ends at 9 AM.  Patients are seen on a first-come first-served basis, and a limited number are seen during each  clinic.   [facility]  [address] Hillard Danker [place], [place] [phone]   Eligibility Requirements You must have lived in [place], [place], or [place] counties for at least the last three months.   You cannot be eligible for state or federal sponsored [employer], including [employer], [place], or [employer].   You generally cannot be eligible for healthcare insurance through your employer.    How to apply: Eligibility screenings are held every Tuesday and Wednesday afternoon from 1:00 pm until 4:00 pm. You do not need an appointment for the interview!  [facility] [address] [place], [place]   [place]  [place] Department  [place]  [phone]    Behavioral Health Resources in the Community: Intensive Outpatient Programs Organization         Address  Phone  Notes  [place]. [address] [place], [place] [phone]   [facility] Outpatient [address], [place], [place]   [facility]: [facility] [address], [place], [place]   [place] [address]  [place], [place] or [phone]   Substance Abuse Resources Organization         Address  Phone  Notes  Alcohol and Drug Services  [phone]   [place]  [phone]   [facility] [place]   Chinita Pester  [phone]   Residential & Outpatient Substance Abuse Program  [phone]   Psychological Services Organization         Address  Phone  Notes  [facility] [place]  [place]  [phone]   [place] [address] [place] or [phone]    Mobile Crisis Teams Organization         Address  Phone  Notes  [facility], Mobile  Crisis Care Unit  [phone]   [facility]  [address] [place], [place]   [place] [address], [employer] [place] [phone]    Self-Help/Support Groups Organization         Address  Phone             Notes  [place]. of [place] - variety of support groups  [place] Call for more information  Narcotics Anonymous (NA), [facility] [address], [employer] [place]  2 meetings at this location   Special educational needs teacher         Address  Phone  Notes  [facility] [place],    [place]  [phone]   [facility]  [address], [place] [id number], [place], [place]   [place], [place] [phone] Admissions: 8am-3pm M-F  Incentives Substance [place] [address],    [place], [place] [phone]   [facility] [address] Jadene Pierini [place], [place]   The [place].,  [place], [place] - Intensive Outpatient [place] [address] Kristeen [address], [place], [place]   [facility] ([place]  [address],  [place], [place] [phone] or [phone]   [facility] ([facility]) [address] [place], [place] Accepts Medicaid  [facility] [place] [address]  [place] [phone] Substance Abuse/Addiction Treatment   [facility] Organization         Address  Phone  Notes  [facility]  [phone]   Domenic Schwab, PhD [address] Arlis Porta [place], [place]   [phone] or [phone]   [place]   [address] [place], [place] [phone]   [place], [place], [place] [phone] Insurance/Medicaid/sponsorship through [facility] and Families [address], [address]  [place]                                    [place], [place] [phone] [place] [address] [place], [place] [phone]    Dr. Adele Schilder  [phone]   [facility] Dept. 1) [address] [place] 2) [place] 3)  [place] [address], [place] [phone]  [phone]   [place] [phone] or [phone] (After Hours)

## 2015-01-14 NOTE — ED Provider Notes (Signed)
CSN: [id number]     Arrival date & time 01/14/15  1005 History   First MD Initiated Contact with Patient 01/14/15 1025     Chief Complaint  Patient presents with  . Asthma     (Consider location/radiation/quality/duration/timing/severity/associated sxs/prior Treatment) HPI Cody Terrell is a 29 y.o. male with hx of asthma, presents to ED with complaint of wheezing and shortness of breath. Pt states he was grilling out two days ago, that evening developed shortness of breath and wheezing. States yesterday symptoms continued. He used his inhaler which did not help, but he states that he thinks the inhaler is all out. He took 2 nebulized treatments yesterday which helped some. States "when my asthma gets this bad, i usually need steroids." Patient states he is a former smoker. He states he has also had some allergies, nasal congestion, itchy eyes, sinus pressure. He denies any chest pain. He denies any cough. He states he gets frequent asthma attacks around this time of the year and over the summer. He made an appointment with allergist but states that appointment is not for 3 months. He states he has no fever or chills. No other symptoms.  Past Medical History  Diagnosis Date  . Asthma    History reviewed. No pertinent past surgical history. Family History  Problem Relation Age of Onset  . Asthma Other    History  Substance Use Topics  . Smoking status: Former Games developer  . Smokeless tobacco: Not on file  . Alcohol Use: Yes     Comment: socially    Review of Systems  Constitutional: Negative for fever and chills.  HENT: Positive for congestion and sinus pressure.   Respiratory: Positive for shortness of breath and wheezing. Negative for cough and chest tightness.   Cardiovascular: Negative for chest pain, palpitations and leg swelling.  Gastrointestinal: Negative for nausea, vomiting, abdominal pain, diarrhea and abdominal distention.  Musculoskeletal: Negative for myalgias,  arthralgias, neck pain and neck stiffness.  Skin: Negative for rash.  Allergic/Immunologic: Negative for immunocompromised state.  Neurological: Negative for dizziness, weakness, light-headedness, numbness and headaches.      Allergies  Review of patient's allergies indicates no known allergies.  Home Medications   Prior to Admission medications   Medication Sig Start Date End Date Taking? Authorizing Provider  albuterol (PROVENTIL HFA;VENTOLIN HFA) 108 (90 BASE) MCG/ACT inhaler Inhale 2 puffs into the lungs every 6 (six) hours as needed for wheezing or shortness of breath.    Historical Provider, MD  cetirizine (ZYRTEC ALLERGY) 10 MG tablet Take 1 tablet (10 mg total) by mouth daily. 01/18/13   Hannah Muthersbaugh, PA-C  cetirizine-pseudoephedrine (ZYRTEC-D) 5-120 MG per tablet Take 1 tablet by mouth daily. 01/11/14   Quita Skye, MD  Multiple Vitamins-Minerals (MULTIVITAMIN WITH MINERALS) tablet Take 1 tablet by mouth daily.    Historical Provider, MD  predniSONE (DELTASONE) 20 MG tablet Take 2 tablets (40 mg total) by mouth daily. 01/11/14   Quita Skye, MD   BP 122/70 mmHg  Pulse 83  Temp(Src) 98.3 F (36.8 C) (Oral)  Resp 16  SpO2 95% Physical Exam  Constitutional: He appears well-developed and well-nourished. No distress.  HENT:  Head: Normocephalic and atraumatic.  Right Ear: Tympanic membrane, external ear and ear canal normal.  Left Ear: Tympanic membrane, external ear and ear canal normal.  Nose: Rhinorrhea present.  Mouth/Throat: Uvula is midline, oropharynx is clear and moist and mucous membranes are normal.  Eyes: Conjunctivae are normal.  Neck: Neck supple.  Cardiovascular: Normal  rate, regular rhythm and normal heart sounds.   Pulmonary/Chest: Effort normal. No respiratory distress. He has wheezes. He has no rales.  End expiratory wheezes bilaterally  Musculoskeletal: He exhibits no edema.  Neurological: He is alert.  Skin: Skin is warm and dry.  Nursing note and  vitals reviewed.   ED Course  Procedures (including critical care time) Labs Review Labs Reviewed - No data to display  Imaging Review No results found.   EKG Interpretation None      MDM   Final diagnoses:  Asthma exacerbation  Seasonal allergies    patient with wheezing and shortness of breath for the last 2 days. He received one breathing treatment right after triage and his wheezing has resolved. He denies any chest pain, cough, no fever or chills. This is most likely asthma exacerbation from the smoke while grilling out. He is feeling better after breathing treatment. I think at this time stable for discharge home. We will start a prednisone taper. Also will refill his nebulizer treatments and inhaler. Will start on allergy medications, advised to take Zyrtec and will give prescription for Flonase. Follow up with primary care doctor.  Filed Vitals:   01/14/15 1011  BP: 122/70  Pulse: 83  Temp: 98.3 F (36.8 C)  TempSrc: Oral  Resp: 16  SpO2: 95%     Jaynie Crumbleatyana Tristian Sickinger, PA-C 01/14/15 1818  Rolland PorterMark Archie, MD 01/19/15 [phone]

## 2015-01-14 NOTE — ED Notes (Signed)
Pt c/o asthma exacerbation after cooking out on a grill with smoke present 2 days ago. Pt took 2 breathing treatments yesterday and a dose from his inhaler yesterday, which relieved symptoms temporarily. Pt states he is nearly out of his inhaler medication. Expiratory wheezes present.

## 2015-01-14 NOTE — ED Notes (Signed)
Contacted by [facility] requesting premix nebulizer solution for Albuterol prescription.  Authorization provided.

## 2017-02-13 ENCOUNTER — Encounter (HOSPITAL_COMMUNITY): Payer: Self-pay

## 2017-02-13 ENCOUNTER — Emergency Department (HOSPITAL_COMMUNITY)
Admission: EM | Admit: 2017-02-13 | Discharge: 2017-02-13 | Disposition: A | Discharge status: Home / Self Care | Payer: 59 | Attending: Emergency Medicine | Admitting: Emergency Medicine

## 2017-02-13 DIAGNOSIS — M791 Myalgia: Secondary | ICD-10-CM | POA: Diagnosis not present

## 2017-02-13 DIAGNOSIS — M549 Dorsalgia, unspecified: Secondary | ICD-10-CM | POA: Diagnosis not present

## 2017-02-13 DIAGNOSIS — J45909 Unspecified asthma, uncomplicated: Secondary | ICD-10-CM | POA: Diagnosis not present

## 2017-02-13 DIAGNOSIS — Z79899 Other long term (current) drug therapy: Secondary | ICD-10-CM | POA: Diagnosis not present

## 2017-02-13 DIAGNOSIS — M7918 Myalgia, other site: Secondary | ICD-10-CM

## 2017-02-13 DIAGNOSIS — Z87891 Personal history of nicotine dependence: Secondary | ICD-10-CM | POA: Diagnosis not present

## 2017-02-13 DIAGNOSIS — M533 Sacrococcygeal disorders, not elsewhere classified: Secondary | ICD-10-CM | POA: Diagnosis present

## 2017-02-13 MED ORDER — IBUPROFEN 800 MG PO TABS
800.0000 mg | ORAL_TABLET | Freq: Once | ORAL | Status: DC
  Filled 2017-02-13: qty 1

## 2017-02-13 NOTE — ED Provider Notes (Signed)
[facility]-EMERGENCY DEPT Provider Note   CSN: [id number] Arrival date & time: 02/13/17  0247     History   Chief Complaint Chief Complaint  Patient presents with  . Back Pain    HPI Cody Terrell is a 31 y.o. male presenting with 3 days of sudden achy pain around his tailbone the pain has been constant and is worse with sitting. It has been okay with walking but nothing seems to make it better. He has noted some induration in the skin in that area. He has tried ice and hemorrhoid cream without much relief. He does report a history of perianal abscess in 2005. She is otherwise well just uncomfortable. He denies fever, chills, nausea, vomiting or other symptoms.  HPI  Past Medical History:  Diagnosis Date  . Asthma     There are no active problems to display for this patient.   History reviewed. No pertinent surgical history.     Home Medications    Prior to Admission medications   Medication Sig Start Date End Date Taking? Authorizing Provider  albuterol (PROVENTIL) (5 MG/ML) 0.5% nebulizer solution Take 0.5 mLs (2.5 mg total) by nebulization every 6 (six) hours as needed for wheezing or shortness of breath. 01/14/15   Kirichenko, Lemont Fillers, PA-C  cetirizine (ZYRTEC ALLERGY) 10 MG tablet Take 1 tablet (10 mg total) by mouth daily. 01/18/13   Muthersbaugh, Dahlia Client, PA-C  cetirizine-pseudoephedrine (ZYRTEC-D) 5-120 MG per tablet Take 1 tablet by mouth daily. 01/11/14   Quita Skye, MD  fluticasone (FLONASE) 50 MCG/ACT nasal spray Place 2 sprays into both nostrils daily. 01/14/15   Kirichenko, Lemont Fillers, PA-C  Multiple Vitamins-Minerals (MULTIVITAMIN WITH MINERALS) tablet Take 1 tablet by mouth daily.    [provider]  predniSONE (DELTASONE) 10 MG tablet Take 5 tab day 1, take 4 tab day 2, take 3 tab day 3, take 2 tab day 4, and take 1 tab day 5 01/14/15   Jaynie Crumble, PA-C    Family History Family History  Problem Relation Age of Onset  . Asthma Other     Social  History Social History  Substance Use Topics  . Smoking status: Former Games developer  . Smokeless tobacco: Never Used  . Alcohol use Yes     Comment: socially     Allergies   Patient has no known allergies.   Review of Systems Review of Systems  Constitutional: Negative for chills and fever.  Eyes: Negative for pain and visual disturbance.  Respiratory: Negative for cough, shortness of breath, wheezing and stridor.   Cardiovascular: Negative for chest pain and palpitations.  Gastrointestinal: Negative for abdominal pain, diarrhea, nausea and vomiting.  Genitourinary: Negative for dysuria and hematuria.  Musculoskeletal: Positive for back pain and myalgias. Negative for arthralgias, gait problem, joint swelling, neck pain and neck stiffness.  Skin: Negative for color change, pallor, rash and wound.       Induration in his sacral area.  Neurological: Negative for seizures, syncope and weakness.     Physical Exam Updated Vital Signs BP 119/80   Pulse 71   Temp 97.5 F (36.4 C) (Oral)   Resp 17   SpO2 99%   Physical Exam  Constitutional: He appears well-developed and well-nourished. No distress.  Patient is afebrile, nontoxic-appearing, lying comfortably in bed in no acute distress.  HENT:  Head: Normocephalic and atraumatic.  Eyes: Conjunctivae are normal.  Neck: Normal range of motion.  Cardiovascular: Normal rate, regular rhythm, normal heart sounds and intact distal pulses.   Pulmonary/Chest:  Effort normal and breath sounds normal. No respiratory distress. He has no wheezes.  Abdominal: He exhibits no distension.  Musculoskeletal: Normal range of motion. He exhibits tenderness. He exhibits no edema or deformity.  Sacral area   Neurological: He is alert. He displays normal reflexes. No sensory deficit. He exhibits normal muscle tone.  5/5 strength rested flexion extension of knee, hip, plantar f/exion, dosiflexion  Skin: Skin is warm. No rash noted. He is not diaphoretic.  No erythema. No pallor.  Patient with some induration and tenderness in the sacral area no obvious abscess. No erythema, warmth, swelling.  Psychiatric: He has a normal mood and affect.  Nursing note and vitals reviewed.    ED Treatments / Results  Labs (all labs ordered are listed, but only abnormal results are displayed) Labs Reviewed - No data to display  EKG  EKG Interpretation None       Radiology No results found.  Procedures Procedures (including critical care time) EMERGENCY DEPARTMENT US SOFT TISSUE INTERPRETATION "[employer]: Limited Soft Tissue Ultrasound"  INDICATIONS: Pain Multiple views of the body part were obtained in real-time with a multi-frequency linear probe  PERFORMED BY: Myself IMAGES ARCHIVED?: Yes SIDE:Right  BODY PART:Lower back INTERPRETATION:  No abcess noted and No cellulitis noted   Medications Ordered in ED Medications  ibuprofen (ADVIL,MOTRIN) tablet 800 mg (800 mg Oral Refused 02/13/17 0855)     Initial Impression / Assessment and Plan / ED Course  I have reviewed the triage vital signs and the nursing notes.  Pertinent labs & imaging results that were available during my care of the patient were reviewed by me and considered in my medical decision making (see chart for details).     Patient presenting with sacral tenderness. No midline tenderness palpation of the spine. Normal neuro exam lower extremities are neurovascularly intact bilaterally. Patient is ambulating without difficulty normal stance and gait.   US without abscess or cellulitis. Patient is stable, nontoxic, afebrile. Patient's pain was managed in the ED. He declined ibuprofen and heat as he will be waiting to get home.  Discharge home with symptomatic relief and close PCP follow-up. Advised conservative management with heat, ibuprofen and monitoring for any worsening.  Discussed strict return precautions and advised to return to the emergency department if experiencing  any new or worsening symptoms. Instructions were understood and patient agreed with discharge plan. Final Clinical Impressions(s) / ED Diagnoses   Final diagnoses:  Buttock pain    New Prescriptions New Prescriptions   No medications on file     Gregary CromerMitchell, Avanti Jetter B, PA-C 02/13/17 [id number]    Cathren LaineSteinl, Kevin, MD 02/13/17 1013

## 2017-02-13 NOTE — Discharge Instructions (Signed)
As discussed, please use heat and ibuprofen for pain. Monitor for any worsening or change in the area. Follow-up with a primary care provider in a few days.  Return to be seen if you experience warmth, redness, increased pain and swelling, fever, chills or any other new concerning symptoms in the meantime.

## 2017-02-13 NOTE — ED Notes (Signed)
Patient refused heat pack as ordered for back pain. No acute distress noted.

## 2017-02-13 NOTE — ED Triage Notes (Signed)
Pt complaining of bilateral lower back pain. Pt denies any injury/trauma. Pt denies any hx of same.

## 2017-12-14 ENCOUNTER — Encounter: Payer: Self-pay | Admitting: Family Medicine

## 2017-12-14 ENCOUNTER — Ambulatory Visit: Payer: 59 | Admitting: Family Medicine

## 2017-12-14 VITALS — BP 118/68 | HR 108 | Temp 98.4°F | Ht 74.0 in | Wt 205.0 lb

## 2017-12-14 DIAGNOSIS — H9312 Tinnitus, left ear: Secondary | ICD-10-CM | POA: Diagnosis not present

## 2017-12-14 DIAGNOSIS — Z0000 Encounter for general adult medical examination without abnormal findings: Secondary | ICD-10-CM | POA: Diagnosis not present

## 2017-12-14 DIAGNOSIS — Z131 Encounter for screening for diabetes mellitus: Secondary | ICD-10-CM | POA: Diagnosis not present

## 2017-12-14 DIAGNOSIS — Z13220 Encounter for screening for lipoid disorders: Secondary | ICD-10-CM | POA: Diagnosis not present

## 2017-12-14 DIAGNOSIS — J4520 Mild intermittent asthma, uncomplicated: Secondary | ICD-10-CM | POA: Diagnosis not present

## 2017-12-14 MED ORDER — ALBUTEROL SULFATE HFA 108 (90 BASE) MCG/ACT IN AERS: 2.0000 | INHALATION_SPRAY | Freq: Four times a day (QID) | RESPIRATORY_TRACT | 4 refills | Status: DC | PRN

## 2017-12-14 NOTE — Patient Instructions (Addendum)
Preventive Care 18-39 Years, Male Preventive care refers to lifestyle choices and visits with your health care provider that can promote health and wellness. What does preventive care include?  A yearly physical exam. This is also called an annual well check.  Dental exams once or twice a year.  Routine eye exams. Ask your health care provider how often you should have your eyes checked.  Personal lifestyle choices, including: ? Daily care of your teeth and gums. ? Regular physical activity. ? Eating a healthy diet. ? Avoiding tobacco and drug use. ? Limiting alcohol use. ? Practicing safe sex. What happens during an annual well check? The services and screenings done by your health care provider during your annual well check will depend on your age, overall health, lifestyle risk factors, and family history of disease. Counseling Your health care provider may ask you questions about your:  Alcohol use.  Tobacco use.  Drug use.  Emotional well-being.  Home and relationship well-being.  Sexual activity.  Eating habits.  Work and work Statistician.  Screening You may have the following tests or measurements:  Height, weight, and BMI.  Blood pressure.  Lipid and cholesterol levels. These may be checked every 5 years starting at age 76.  Diabetes screening. This is done by checking your blood sugar (glucose) after you have not eaten for a while (fasting).  Skin check.  Hepatitis C blood test.  Hepatitis B blood test.  Sexually transmitted disease (STD) testing.  Discuss your test results, treatment options, and if necessary, the need for more tests with your health care provider. Vaccines Your health care provider may recommend certain vaccines, such as:  Influenza vaccine. This is recommended every year.  Tetanus, diphtheria, and acellular pertussis (Tdap, Td) vaccine. You may need a Td booster every 10 years.  Varicella vaccine. You may need this if you  have not been vaccinated.  HPV vaccine. If you are 40 or younger, you may need three doses over 6 months.  Measles, mumps, and rubella (MMR) vaccine. You may need at least one dose of MMR.You may also need a second dose.  Pneumococcal 13-valent conjugate (PCV13) vaccine. You may need this if you have certain conditions and have not been vaccinated.  Pneumococcal polysaccharide (PPSV23) vaccine. You may need one or two doses if you smoke cigarettes or if you have certain conditions.  Meningococcal vaccine. One dose is recommended if you are age 31-21 years and a first-year college student living in a residence hall, or if you have one of several medical conditions. You may also need additional booster doses.  Hepatitis A vaccine. You may need this if you have certain conditions or if you travel or work in places where you may be exposed to hepatitis A.  Hepatitis B vaccine. You may need this if you have certain conditions or if you travel or work in places where you may be exposed to hepatitis B.  Haemophilus influenzae type b (Hib) vaccine. You may need this if you have certain risk factors.  Talk to your health care provider about which screenings and vaccines you need and how often you need them. This information is not intended to replace advice given to you by your health care provider. Make sure you discuss any questions you have with your health care provider. Document Released: 10/26/2001 Document Revised: 05/19/2016 Document Reviewed: 07/01/2015 Elsevier Interactive Patient Education  2018 [place], Adult An allergy is when your body's defense system (immune system) overreacts to  an otherwise harmless substance (allergen) that you breathe in or eat or something that touches your skin. When you come into contact with something that you are allergic to, your immune system produces certain proteins (antibodies). These proteins cause cells to release chemicals  (histamines) that trigger the symptoms of an allergic reaction. Allergies often affect the nasal passages (allergic rhinitis), eyes (allergic conjunctivitis), skin (atopic dermatitis), and stomach. Allergies can be mild or severe. Allergies cannot spread from person to person (are not contagious). They can develop at any age and may be outgrown. What increases the risk? You may be at greater risk of allergies if other people in your family have allergies. What are the signs or symptoms? Symptoms depend on what type of allergy you have. They may include:  Runny, stuffy nose.  Sneezing.  Itchy mouth, ears, or throat.  Postnasal drip.  Sore throat.  Itchy, red, watery, or puffy eyes.  Skin rash or hives.  Stomach pain.  Vomiting.  Diarrhea.  Bloating.  Wheezing or coughing.  People with a severe allergy to food, medicine, or an insect bite may have a life-threatening allergic reaction (anaphylaxis). Symptoms of anaphylaxis include:  Hives.  Itching.  Flushed face.  Swollen lips, tongue, or mouth.  Tight or swollen throat.  Chest pain or tightness in the chest.  Trouble breathing or shortness of breath.  Rapid heartbeat.  Dizziness or fainting.  Vomiting.  Diarrhea.  Pain in the abdomen.  How is this diagnosed? This condition is diagnosed based on:  Your symptoms.  Your family and medical history.  A physical exam.  You may need to see a health care provider who specializes in treating allergies (allergist). You may also have tests, including:  Skin tests to see which allergens are causing your symptoms, such as: ? Skin prick test. In this test, your skin is pricked with a tiny needle and exposed to small amounts of possible allergens to see if your skin reacts. ? Intradermal skin test. In this test, a small amount of allergen is injected under your skin to see if your skin reacts. ? Patch test. In this test, a small amount of allergen is placed on  your skin and then your skin is covered with a bandage. Your health care provider will check your skin after a couple of days to see if a rash has developed.  Blood tests.  Challenges tests. In this test, you inhale a small amount of allergen by mouth to see if you have an allergic reaction.  You may also be asked to:  Keep a food diary. A food diary is a record of all the foods and drinks you have in a day and any symptoms you experience.  Practice an elimination diet. An elimination diet involves eliminating specific foods from your diet and then adding them back in one by one to find out if a certain food causes an allergic reaction.  How is this treated? Treatment for allergies depends on your symptoms. Treatment may include:  Cold compresses to soothe itching and swelling.  Eye drops.  Nasal sprays.  Using a saline spray or container (neti pot) to flush out the nose (nasal irrigation). These methods can help clear away mucus and keep the nasal passages moist.  Using a humidifier.  Oral antihistamines or other medicines to block allergic reaction and inflammation.  Skin creams to treat rashes or itching.  Diet changes to eliminate food allergy triggers.  Repeated exposure to tiny amounts of allergens to  build up a tolerance and prevent future allergic reactions (immunotherapy). These include: ? Allergy shots. ? Oral treatment. This involves taking small doses of an allergen under the tongue (sublingual immunotherapy).  Emergency epinephrine injection (auto-injector) in case of an allergic emergency. This is a self-injectable, pre-measured medicine that must be given within the first few minutes of a serious allergic reaction.  Follow these instructions at home:  Avoid known allergens whenever possible.  If you suffer from airborne allergens, wash out your nose daily. You can do this with a saline spray or a neti pot to flush out your nose (nasal irrigation).  Take  over-the-counter and prescription medicines only as told by your health care provider.  Keep all follow-up visits as told by your health care provider. This is important.  If you are at risk of a severe allergic reaction (anaphylaxis), keep your auto-injector with you at all times.  If you have ever had anaphylaxis, wear a medical alert bracelet or necklace that states you have a severe allergy. Contact a health care provider if:  Your symptoms do not improve with treatment. Get help right away if:  You have symptoms of anaphylaxis, such as: ? Swollen mouth, tongue, or throat. ? Pain or tightness in your chest. ? Trouble breathing or shortness of breath. ? Dizziness or fainting. ? Severe abdominal pain, vomiting, or diarrhea. This information is not intended to replace advice given to you by your health care provider. Make sure you discuss any questions you have with your health care provider. Document Released: 11/23/2002 Document Revised: 12/29/2016 Document Reviewed: 03/17/2016 Elsevier Interactive Patient Education  2018 [employer]. Tinnitus Tinnitus refers to hearing a sound when there is no actual source for that sound. This is often described as ringing in the ears. However, people with this condition may hear a variety of noises. A person may hear the sound in one ear or in both ears. The sounds of tinnitus can be soft, loud, or somewhere in between. Tinnitus can last for a few seconds or can be constant for days. It may go away without treatment and come back at various times. When tinnitus is constant or happens often, it can lead to other problems, such as trouble sleeping and trouble concentrating. Almost everyone experiences tinnitus at some point. Tinnitus that is long-lasting (chronic) or comes back often is a problem that may require medical attention. What are the causes? The cause of tinnitus is often not known. In some cases, it can result from other problems or  conditions, including:  Exposure to loud noises from machinery, music, or other sources.  Hearing loss.  Ear or sinus infections.  Earwax buildup.  A foreign object in the ear.  Use of certain medicines.  Use of alcohol and caffeine.  High blood pressure.  Heart diseases.  Anemia.  Allergies.  Meniere disease.  Thyroid problems.  Tumors.  An enlarged part of a weakened blood vessel (aneurysm).  What are the signs or symptoms? The main symptom of tinnitus is hearing a sound when there is no source for that sound. It may sound like:  Buzzing.  Roaring.  Ringing.  Blowing air, similar to the sound heard when you listen to a seashell.  Hissing.  Whistling.  Sizzling.  Humming.  Running water.  A sustained musical note.  How is this diagnosed? Tinnitus is diagnosed based on your symptoms. Your health care provider will do a physical exam. A comprehensive hearing exam (audiologic exam) will be done if your  tinnitus:  Affects only one ear (unilateral).  Causes hearing difficulties.  Lasts 6 months or longer.  You may also need to see a health care provider who specializes in hearing disorders (audiologist). You may be asked to complete a questionnaire to determine the severity of your tinnitus. Tests may be done to help determine the cause and to rule out other conditions. These can include:  Imaging studies of your head and brain, such as: ? A CT scan. ? An MRI.  An imaging study of your blood vessels (angiogram).  How is this treated? Treating an underlying medical condition can sometimes make tinnitus go away. If your tinnitus continues, other treatments may include:  Medicines, such as certain antidepressants or sleeping aids.  Sound generators to mask the tinnitus. These include: ? Tabletop sound machines that play relaxing sounds to help you fall asleep. ? Wearable devices that fit in your ear and play sounds or music. ? A small device  that uses headphones to deliver a signal embedded in music (acoustic neural stimulation). In time, this may change the pathways of your brain and make you less sensitive to tinnitus. This device is used for very severe cases when no other treatment is working.  Therapy and counseling to help you manage the stress of living with tinnitus.  Using hearing aids or cochlear implants, if your tinnitus is related to hearing loss.  Follow these instructions at home:  When possible, avoid being in loud places and being exposed to loud sounds.  Wear hearing protection, such as earplugs, when you are exposed to loud noises.  Do not take stimulants, such as nicotine, alcohol, or caffeine.  Practice techniques for reducing stress, such as meditation, yoga, or deep breathing.  Use a white noise machine, a humidifier, or other devices to mask the sound of tinnitus.  Sleep with your head slightly raised. This may reduce the impact of tinnitus.  Try to get plenty of rest each night. Contact a health care provider if:  You have tinnitus in just one ear.  Your tinnitus continues for 3 weeks or longer without stopping.  Home care measures are not helping.  You have tinnitus after a head injury.  You have tinnitus along with any of the following: ? Dizziness. ? Loss of balance. ? Nausea and vomiting. This information is not intended to replace advice given to you by your health care provider. Make sure you discuss any questions you have with your health care provider. Document Released: 08/30/2005 Document Revised: 05/02/2016 Document Reviewed: 01/30/2014 Elsevier Interactive Patient Education  2018 [employer].

## 2017-12-14 NOTE — Progress Notes (Signed)
Patient presents to clinic today to establish care.  SUBJECTIVE: PMH:  Pt is a 32 yo male with pmh sig for asthma and allergies.  Pt typically goes to the ED for his health care needs.  Asthma: -symptoms typically occur in the Spring/"when the weather changes" -pt will use albuterol prn  Allergies, seasonal: -pt may take OTC medication prn -tries to take prior to the start of symptoms.  Tinnitus: -L ear -occurred last wk, but has since stopped -sound noted as a ringing  Allergies: NKDA  Past Surgical hx: none  Social hx: Pt is single.  He has a 43-year-old son.  Patient is currently employed at [employer] but works from home.  Pt denies current tobacco or drug use.  Pt endorses former tobacco use.  He states he smoked 1/2-1 pack/day from age 72 until 5 years ago when he quit.  Pt endorses alcohol use.  May drink 2 beers per week.   Past Medical History:  Diagnosis Date  . Asthma     History reviewed. No pertinent surgical history.  Current Outpatient Medications on File Prior to Visit  Medication Sig Dispense Refill  . albuterol (PROVENTIL) (5 MG/ML) 0.5% nebulizer solution Take 0.5 mLs (2.5 mg total) by nebulization every 6 (six) hours as needed for wheezing or shortness of breath. 20 mL 12  . cetirizine (ZYRTEC ALLERGY) 10 MG tablet Take 1 tablet (10 mg total) by mouth daily. (Patient not taking: Reported on 12/14/2017) 30 tablet 1  . cetirizine-pseudoephedrine (ZYRTEC-D) 5-120 MG per tablet Take 1 tablet by mouth daily. (Patient not taking: Reported on 12/14/2017) 30 tablet 0  . fluticasone (FLONASE) 50 MCG/ACT nasal spray Place 2 sprays into both nostrils daily. (Patient not taking: Reported on 12/14/2017) 16 g 2  . Multiple Vitamins-Minerals (MULTIVITAMIN WITH MINERALS) tablet Take 1 tablet by mouth daily.    . predniSONE (DELTASONE) 10 MG tablet Take 5 tab day 1, take 4 tab day 2, take 3 tab day 3, take 2 tab day 4, and take 1 tab day 5 (Patient not taking:  Reported on 12/14/2017) 15 tablet 0   No current facility-administered medications on file prior to visit.     No Known Allergies  Family History  Problem Relation Age of Onset  . Asthma Other     Social History   Socioeconomic History  . Marital status: Single    Spouse name: Not on file  . Number of children: Not on file  . Years of education: Not on file  . Highest education level: Not on file  Occupational History  . Not on file  Social Needs  . Financial resource strain: Not on file  . Food insecurity:    Worry: Not on file    Inability: Not on file  . Transportation needs:    Medical: Not on file    Non-medical: Not on file  Tobacco Use  . Smoking status: Former Games developer  . Smokeless tobacco: Never Used  Substance and Sexual Activity  . Alcohol use: Yes    Comment: socially  . Drug use: No  . Sexual activity: Yes    Birth control/protection: None  Lifestyle  . Physical activity:    Days per week: Not on file    Minutes per session: Not on file  . Stress: Not on file  Relationships  . Social connections:    Talks on phone: Not on file    Gets together: Not on file    Attends  religious service: Not on file    Active member of club or organization: Not on file    Attends meetings of clubs or organizations: Not on file    Relationship status: Not on file  . Intimate partner violence:    Fear of current or ex partner: Not on file    Emotionally abused: Not on file    Physically abused: Not on file    Forced sexual activity: Not on file  Other Topics Concern  . Not on file  Social History Narrative  . Not on file    ROS General: Denies fever, chills, night sweats, changes in weight, changes in appetite HEENT: Denies headaches, ear pain, changes in vision, rhinorrhea, sore throat  +tinnitus, now resolved CV: Denies CP, palpitations, SOB, orthopnea Pulm: Denies SOB, cough, wheezing GI: Denies abdominal pain, nausea, vomiting, diarrhea, constipation GU:  Denies dysuria, hematuria, frequency, vaginal discharge Msk: Denies muscle cramps, joint pains Neuro: Denies weakness, numbness, tingling Skin: Denies rashes, bruising Psych: Denies depression, anxiety, hallucinations  BP 118/68 (BP Location: Left Arm, Patient Position: Sitting, Cuff Size: Normal)   Pulse (!) 108   Temp 98.4 F (36.9 C) (Oral)   Ht 6\' 2"  (1.88 m)   Wt 205 lb (93 kg)   SpO2 98%   BMI 26.32 kg/m   Physical Exam Gen. Pleasant, well developed, well-nourished, in NAD HEENT - [place]/AT, PERRL,  no scleral icterus, no nasal drainage, pharynx without erythema or exudate.  TMs normal bilaterally.  No cervical lymphadenopathy. Neck: No JVD, no thyromegaly Lungs: no use of accessory muscles, CTAB, no wheezes, rales or rhonchi Cardiovascular: RRR, No r/g/m, no peripheral edema Abdomen: BS present, soft, nontender, nondistended, no hepatosplenomegaly Musculoskeletal: No deformities, moves all four extremities, no cyanosis or clubbing, normal tone Neuro:  A&Ox3, CN II-XII intact, normal gait Skin:  Warm, dry, intact, no lesions Psych: normal affect, mood appropriate  No results found for this or any previous visit (from the past 2160 hour(s)).  Assessment/Plan: Well adult exam  -Anticipatory guidance given including wearing seatbelts, smoke detectors in the home, increasing physical activity, increasing p.o. intake of water and vegetables. -Handout given -Patient will return to have labs done when fasting. - Plan: CBC with Differential/Platelet, Basic metabolic panel -Next CPE in 1 year  Asthma, allergic, mild intermittent, uncomplicated  -Patient advised to start taking p.o. allergy medicine daily.  Has Rx for cetirizine - Plan: albuterol (PROVENTIL HFA;VENTOLIN HFA) 108 (90 Base) MCG/ACT inhaler  Tinnitus of left ear -Discussed possible causes -Patient given handout  Screening for diabetes mellitus  - Plan: Hemoglobin A1c  Screening for cholesterol level  - Plan:  Lipid panel  Follow-up PRN  Abbe AmsterdamShannon Dangelo Guzzetta, MD

## 2017-12-22 ENCOUNTER — Other Ambulatory Visit (INDEPENDENT_AMBULATORY_CARE_PROVIDER_SITE_OTHER): Payer: 59

## 2017-12-22 DIAGNOSIS — Z0000 Encounter for general adult medical examination without abnormal findings: Secondary | ICD-10-CM | POA: Diagnosis not present

## 2017-12-22 LAB — CBC WITH DIFFERENTIAL/PLATELET
Basophils Absolute: 0 10*3/uL (ref 0.0–0.1)
Basophils Relative: 0.6 % (ref 0.0–3.0)
Eosinophils Absolute: 0.4 10*3/uL (ref 0.0–0.7)
Eosinophils Relative: 4.8 % (ref 0.0–5.0)
HEMATOCRIT: 45 % (ref 39.0–52.0)
Hemoglobin: 15.1 g/dL (ref 13.0–17.0)
LYMPHS PCT: 36.5 % (ref 12.0–46.0)
Lymphs Abs: 2.7 10*3/uL (ref 0.7–4.0)
MCHC: 33.5 g/dL (ref 30.0–36.0)
MCV: 91.8 fl (ref 78.0–100.0)
MONO ABS: 0.4 10*3/uL (ref 0.1–1.0)
Monocytes Relative: 5.8 % (ref 3.0–12.0)
NEUTROS ABS: 3.9 10*3/uL (ref 1.4–7.7)
Neutrophils Relative %: 52.3 % (ref 43.0–77.0)
Platelets: 251 10*3/uL (ref 150.0–400.0)
RBC: 4.9 Mil/uL (ref 4.22–5.81)
RDW: 13.3 % (ref 11.5–15.5)
WBC: 7.5 10*3/uL (ref 4.0–10.5)

## 2018-01-02 ENCOUNTER — Telehealth: Payer: Self-pay | Admitting: Family Medicine

## 2018-01-02 NOTE — Telephone Encounter (Signed)
Please advise 

## 2018-01-02 NOTE — Telephone Encounter (Signed)
Patient requesting Proventil nebs and Deltasone. LOV 12/14/17  PCP Dr. Salomon FickBanks

## 2018-01-02 NOTE — Telephone Encounter (Signed)
Copied from CRM [id number]. Topic: Quick Communication - Rx Refill/Question >> Jan 02, 2018  8:32 AM Gloriann LoanPayne, Jameison Haji L wrote: Medication: albuterol (PROVENTIL) (5 MG/ML) 0.5% nebulizer solution   predniSONE (DELTASONE) 10 MG tablet     pt states that him and Salomon FickBanks had discussed about these medicines at last OV Has the patient contacted their pharmacy? yes  (Agent: If no, request that the patient contact the pharmacy for the refill.) Preferred Pharmacy (with phone number or street name): [facility] [address] - [place], [place] - [id number] [phone] (Phone) [phone] (Fax)     Agent: Please be advised that RX refills may take up to 3 business days. We ask that you follow-up with your pharmacy.

## 2018-01-04 NOTE — Telephone Encounter (Signed)
I sent in an albuterol inhaler at the time of last OFV.  We did not discuss prednisone at that time.  I am ok with the albuterol nebulizer soln.

## 2018-01-05 ENCOUNTER — Other Ambulatory Visit: Payer: Self-pay | Admitting: Family Medicine

## 2018-01-05 MED ORDER — ALBUTEROL SULFATE (5 MG/ML) 0.5% IN NEBU: 2.5000 mg | INHALATION_SOLUTION | Freq: Four times a day (QID) | RESPIRATORY_TRACT | 12 refills | Status: AC | PRN

## 2018-01-05 NOTE — Telephone Encounter (Signed)
Medication refill for the neb sol was e-scribed to the pharmacy.

## 2018-02-15 ENCOUNTER — Other Ambulatory Visit: Payer: Self-pay | Admitting: Family Medicine

## 2018-02-15 ENCOUNTER — Telehealth: Payer: Self-pay | Admitting: Family Medicine

## 2018-02-15 NOTE — Telephone Encounter (Signed)
Called pt left a message to call his pharmacy since Rx has more refills at the pharmacy

## 2018-02-15 NOTE — Telephone Encounter (Signed)
Called pt. To discuss request for refill on his Albuterol Nebulizer solution.  Last refill was 01/05/18; #20 ml; RF x 12.  The pt. is requesting to have "the premixed vials", instead of having to mix the medication with saline.  Reported he did get a recent refill of the current prescription, but is requesting that this get changed before his next refill.  Advised will send message to Dr. Salomon FickBanks.  Last office visit: 12/14/17 PCP: Dr. Salomon FickBanks [facility] [address]

## 2018-02-15 NOTE — Telephone Encounter (Signed)
Pt states he realizes there on refills, however the pharmacy gave him a nebulizer solution that needs to be diluted with saline. Instructions on the bottle state "do not use without diluting" Pt prefers the  "Premixed" albuterol (PROVENTIL) (5 MG/ML) 0.5% nebulizer solution And he can use and doe not have to mix anything.  [facility] [address] - [place], [place] - [id number] [phone] (Phone) [phone] (F

## 2018-02-15 NOTE — Telephone Encounter (Signed)
Copied from CRM #[id number]. Topic: Inquiry °>> Feb 15, 2018  8:38 AM Robinson, Andra M wrote: °Reason for CRM: Patient called requesting a refill of Albuterol (PROVENTIL) (5 MG/ML) 0.5% nebulizer solution. Patient's preferred pharmacy is [facility] - [place], [place] - [address] [phone] (Phone)   [phone] (Fax).    °Thank You!!! ° ° ° ° ° °

## 2018-02-15 NOTE — Telephone Encounter (Signed)
Copied from CRM [phone]#[id number]. Topic: Inquiry >> Feb 15, 2018  8:38 AM Yvonna Alanisobinson, Andra M wrote: Reason for CRM: Patient called requesting a refill of Albuterol (PROVENTIL) (5 MG/ML) 0.5% nebulizer solution. [facility] [address] - [place], [place] - [address] [phone] (Phone)   [phone] (Fax).    Thank You!!!

## 2018-02-16 NOTE — Telephone Encounter (Signed)
Rx has been changed to premixed vials Albuterol with his pharmacy per pt request.

## 2018-02-16 NOTE — Telephone Encounter (Signed)
Spoke with pt voiced understanding that his request was sent to pharmacy.

## 2019-08-03 ENCOUNTER — Other Ambulatory Visit: Payer: Self-pay

## 2019-08-03 DIAGNOSIS — Z20822 Contact with and (suspected) exposure to covid-19: Secondary | ICD-10-CM

## 2019-08-06 ENCOUNTER — Other Ambulatory Visit: Payer: Self-pay | Admitting: Family Medicine

## 2019-08-06 DIAGNOSIS — J4520 Mild intermittent asthma, uncomplicated: Secondary | ICD-10-CM

## 2019-08-06 LAB — NOVEL CORONAVIRUS, NAA: SARS-CoV-2, NAA: NOT DETECTED

## 2019-08-06 NOTE — Telephone Encounter (Signed)
Medication Refill - Medication:  albuterol (PROVENTIL HFA;VENTOLIN HFA) 108 (90 Base) MCG/ACT inhaler    albuterol (PROVENTIL) (5 MG/ML) 0.5% nebulizer solution     Patient stated that he would like the solution that is already made   Preferred Pharmacy (with phone number or street name):  [place], [place] - [address] [phone] (Phone) [phone] (Fax)     Agent: Please be advised that RX refills may take up to 3 business days. We ask that you follow-up with your pharmacy.

## 2021-01-30 ENCOUNTER — Other Ambulatory Visit: Payer: Self-pay

## 2021-01-30 ENCOUNTER — Telehealth: Payer: Self-pay | Admitting: Family Medicine

## 2021-01-30 DIAGNOSIS — J4520 Mild intermittent asthma, uncomplicated: Secondary | ICD-10-CM

## 2021-01-30 MED ORDER — ALBUTEROL SULFATE HFA 108 (90 BASE) MCG/ACT IN AERS: 2.0000 | INHALATION_SPRAY | Freq: Four times a day (QID) | RESPIRATORY_TRACT | 2 refills | Status: DC | PRN

## 2021-01-30 MED ORDER — ALBUTEROL SULFATE HFA 108 (90 BASE) MCG/ACT IN AERS: 2.0000 | INHALATION_SPRAY | Freq: Four times a day (QID) | RESPIRATORY_TRACT | 2 refills | Status: AC | PRN

## 2021-01-30 NOTE — Telephone Encounter (Signed)
Pt call and stated he need a refill on albuterol (PROVENTIL HFA;VENTOLIN HFA) 108 (90 Base) MCG/ACT inhaler sent to [facility].pt would like a call back.

## 2021-01-30 NOTE — Telephone Encounter (Signed)
Refill sent.

## 2021-01-30 NOTE — Telephone Encounter (Signed)
First refill was sent to incorrect pharmacy.

## 2022-01-11 ENCOUNTER — Telehealth: Payer: Self-pay | Admitting: Family Medicine

## 2022-01-11 NOTE — Telephone Encounter (Signed)
Pt asking for prescription for nebulizer solution. States he was seen for this back in 2020.advised that an OV would more than likely be needed, pt declined.   [id number] [place], [place] - [address] Phone:  [phone]  ?Fax:  [phone]  ?  ? ?

## 2022-01-21 NOTE — Telephone Encounter (Signed)
Pt last seen in 2019 by this provider.  Will have to decline as pt declines [facility].   ?

## 2022-01-22 NOTE — Telephone Encounter (Signed)
Since pt has not been seen in by Dr Salomon Fick since 2019 and declines to schedule offered visit, pt will not be accepted back as new patient for Dr Salomon Fick. Dr Salomon Fick removed as PCP. ?
